# Patient Record
Sex: Female | Born: 1975 | Race: White | Hispanic: No | Marital: Single | Smoking: Never smoker
Health system: Southern US, Community
[De-identification: ages and names within clinical notes are randomized; demographics above are authoritative.]

## PROBLEM LIST (undated history)

## (undated) DIAGNOSIS — D649 Anemia, unspecified: Secondary | ICD-10-CM

## (undated) HISTORY — PX: CHOLECYSTECTOMY: SHX55

## (undated) HISTORY — PX: TUBAL LIGATION: SHX77

---

## 2012-09-17 ENCOUNTER — Emergency Department (HOSPITAL_BASED_OUTPATIENT_CLINIC_OR_DEPARTMENT_OTHER)
Admission: EM | Admit: 2012-09-17 | Discharge: 2012-09-17 | Disposition: A | Discharge status: Home / Self Care | Payer: Self-pay | Attending: Emergency Medicine | Admitting: Emergency Medicine

## 2012-09-17 ENCOUNTER — Encounter (HOSPITAL_BASED_OUTPATIENT_CLINIC_OR_DEPARTMENT_OTHER): Payer: Self-pay | Admitting: *Deleted

## 2012-09-17 DIAGNOSIS — H5710 Ocular pain, unspecified eye: Secondary | ICD-10-CM | POA: Insufficient documentation

## 2012-09-17 DIAGNOSIS — H5711 Ocular pain, right eye: Secondary | ICD-10-CM

## 2012-09-17 MED ORDER — HYDROCODONE-ACETAMINOPHEN 5-325 MG PO TABS: 2.0000 | ORAL_TABLET | ORAL | Status: DC | PRN

## 2012-09-17 NOTE — ED Notes (Signed)
Pt c/o right eye pain and redness x 2 days 

## 2012-09-17 NOTE — ED Provider Notes (Signed)
History     CSN: [id number]  Arrival date & time 09/17/12  1610   First MD Initiated Contact with Patient 09/17/12 1712      Chief Complaint  Patient presents with  . Eye Problem    (Consider location/radiation/quality/duration/timing/severity/associated sxs/prior treatment) HPI Comments: Patient is a 36 year old female who presents with a history of right eye pain that started suddenly last night. The pain is located under her right eye "in the muscle." The pain is sharp, moderate, and made worse with EOM in all directions. She reports associated redness and swelling of the right eye. She tried tylenol for pain which did not help. She denies visual changes, eye discharge, fever, headache, chest pain, congestion, abdominal pain, NVD.   Patient is a 36 y.o. female presenting with eye problem.  Eye Problem  Associated symptoms include eye redness.    History reviewed. No pertinent past medical history.  Past Surgical History  Procedure Date  . Cholecystectomy     History reviewed. No pertinent family history.  History  Substance Use Topics  . Smoking status: Never Smoker   . Smokeless tobacco: Not on file  . Alcohol Use: No    OB History    Grav Para Term Preterm Abortions TAB SAB Ect Mult Living                  Review of Systems  Eyes: Positive for pain and redness.  All other systems reviewed and are negative.    Allergies  Review of patient's allergies indicates no known allergies.  Home Medications  No current outpatient prescriptions on file.  BP 102/72  Pulse 77  Temp 99 F (37.2 C) (Oral)  Resp 16  Ht 5\' 4"  (1.626 m)  Wt 200 lb (90.719 kg)  BMI 34.33 kg/m2  SpO2 100%  LMP 09/17/2012  Physical Exam  Nursing note and vitals reviewed. Constitutional: She is oriented to person, place, and time. She appears well-developed and well-nourished. No distress.  HENT:  Head: Normocephalic and atraumatic.  Eyes: Conjunctivae normal and EOM are normal.  Pupils are equal, round, and reactive to light. Right eye exhibits no discharge. Left eye exhibits no discharge. No scleral icterus.       Patient endorses pain during EOM in all directions and inability to keep her eyes in any other position than looking straight ahead. However, during interview patient moves her eyes in all directions during conversation without complaints. Right lower eyelid tender to palpation. No abnormal masses or signs of hordeolum/chalazion. No visual acuity discrepancies bilaterally.   Neck: Normal range of motion. Neck supple.  Cardiovascular: Normal rate and regular rhythm.  Exam reveals no gallop and no friction rub.   No murmur heard. Pulmonary/Chest: Effort normal and breath sounds normal. She has no wheezes. She has no rales. She exhibits no tenderness.  Abdominal: Soft. She exhibits no distension.  Musculoskeletal: Normal range of motion.  Neurological: She is alert and oriented to person, place, and time. Coordination normal.       Speech is goal-oriented. Moves limbs without ataxia.   Skin: Skin is warm and dry. She is not diaphoretic.  Psychiatric: She has a normal mood and affect. Her behavior is normal.    ED Course  Procedures (including critical care time)  Labs Reviewed - No data to display No results found.   1. Acute right eye pain       MDM  6:29 PM Patient complaining of eye pain with EOM. Patient  will follow up with Ophthalmology for further evaluation. I will prescribe her vicodin for pain.   6:33 PM Patient endorses extreme pain with EOM during exam but patient is able to hold eye contact with me during interview by laterally abducting her eyes. Patient will be discharged without further evaluation.      Emilia Beck, PA-C 09/17/12 1844

## 2012-09-18 NOTE — ED Provider Notes (Signed)
Medical screening examination/treatment/procedure(s) were performed by non-physician practitioner and as supervising physician I was immediately available for consultation/collaboration.  Geoffery Lyons, MD 09/18/12 1047

## 2012-11-29 ENCOUNTER — Encounter (HOSPITAL_BASED_OUTPATIENT_CLINIC_OR_DEPARTMENT_OTHER): Payer: Self-pay | Admitting: *Deleted

## 2012-11-29 ENCOUNTER — Emergency Department (HOSPITAL_BASED_OUTPATIENT_CLINIC_OR_DEPARTMENT_OTHER)
Admission: EM | Admit: 2012-11-29 | Discharge: 2012-11-29 | Disposition: A | Discharge status: Home / Self Care | Payer: Self-pay | Attending: Emergency Medicine | Admitting: Emergency Medicine

## 2012-11-29 ENCOUNTER — Emergency Department (HOSPITAL_BASED_OUTPATIENT_CLINIC_OR_DEPARTMENT_OTHER): Payer: Self-pay

## 2012-11-29 DIAGNOSIS — R109 Unspecified abdominal pain: Secondary | ICD-10-CM | POA: Insufficient documentation

## 2012-11-29 DIAGNOSIS — R350 Frequency of micturition: Secondary | ICD-10-CM | POA: Insufficient documentation

## 2012-11-29 DIAGNOSIS — M549 Dorsalgia, unspecified: Secondary | ICD-10-CM | POA: Insufficient documentation

## 2012-11-29 DIAGNOSIS — R110 Nausea: Secondary | ICD-10-CM | POA: Insufficient documentation

## 2012-11-29 DIAGNOSIS — G8911 Acute pain due to trauma: Secondary | ICD-10-CM | POA: Insufficient documentation

## 2012-11-29 DIAGNOSIS — Z3202 Encounter for pregnancy test, result negative: Secondary | ICD-10-CM | POA: Insufficient documentation

## 2012-11-29 LAB — PREGNANCY, URINE: Preg Test, Ur: NEGATIVE

## 2012-11-29 LAB — COMPREHENSIVE METABOLIC PANEL
ALT: 37 U/L — ABNORMAL HIGH (ref 0–35)
AST: 37 U/L (ref 0–37)
CO2: 26 mEq/L (ref 19–32)
Chloride: 102 mEq/L (ref 96–112)
Creatinine, Ser: 0.8 mg/dL (ref 0.50–1.10)
GFR calc Af Amer: >90 mL/min (ref >90)
GFR calc non Af Amer: >90 mL/min (ref >90)
Glucose, Bld: 94 mg/dL (ref 70–99)
Sodium: 137 mEq/L (ref 135–145)
Total Bilirubin: 0.2 mg/dL — ABNORMAL LOW (ref 0.3–1.2)

## 2012-11-29 LAB — CBC
Hemoglobin: 12.3 g/dL (ref 12.0–15.0)
MCH: 28.2 pg (ref 26.0–34.0)
MCV: 85.3 fL (ref 78.0–100.0)
Platelets: 260 10*3/uL (ref 150–400)
RBC: 4.36 MIL/uL (ref 3.87–5.11)
WBC: 7.1 10*3/uL (ref 4.0–10.5)

## 2012-11-29 LAB — URINALYSIS, ROUTINE W REFLEX MICROSCOPIC
Bilirubin Urine: NEGATIVE
Glucose, UA: NEGATIVE mg/dL
Specific Gravity, Urine: 1.015 (ref 1.005–1.030)
Urobilinogen, UA: 1 mg/dL (ref 0.0–1.0)
pH: 6.5 (ref 5.0–8.0)

## 2012-11-29 LAB — URINE MICROSCOPIC-ADD ON

## 2012-11-29 MED ORDER — MORPHINE SULFATE 4 MG/ML IJ SOLN
4.0000 mg | Freq: Once | INTRAMUSCULAR | Status: AC
  Administered 2012-11-29: 4 mg via INTRAVENOUS
  Filled 2012-11-29: qty 1

## 2012-11-29 MED ORDER — OXYCODONE-ACETAMINOPHEN 5-325 MG PO TABS: 1.0000 | ORAL_TABLET | Freq: Four times a day (QID) | ORAL | Status: DC | PRN

## 2012-11-29 MED ORDER — IBUPROFEN 600 MG PO TABS: 600.0000 mg | ORAL_TABLET | Freq: Four times a day (QID) | ORAL | Status: DC | PRN

## 2012-11-29 MED ORDER — MORPHINE SULFATE 4 MG/ML IJ SOLN
4.0000 mg | Freq: Once | INTRAMUSCULAR | Status: DC
  Filled 2012-11-29: qty 1

## 2012-11-29 MED ORDER — IOHEXOL 300 MG/ML  SOLN
100.0000 mL | Freq: Once | INTRAMUSCULAR | Status: AC | PRN
  Administered 2012-11-29: 100 mL via INTRAVENOUS

## 2012-11-29 MED ORDER — ONDANSETRON HCL 4 MG/2ML IJ SOLN
4.0000 mg | Freq: Once | INTRAMUSCULAR | Status: AC
  Administered 2012-11-29: 4 mg via INTRAVENOUS
  Filled 2012-11-29: qty 2
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
## 2012-11-30 LAB — URINE CULTURE: Colony Count: NO GROWTH

## 2013-03-20 ENCOUNTER — Emergency Department (HOSPITAL_BASED_OUTPATIENT_CLINIC_OR_DEPARTMENT_OTHER)
Admission: EM | Admit: 2013-03-20 | Discharge: 2013-03-20 | Disposition: A | Discharge status: Home / Self Care | Payer: Self-pay | Attending: Emergency Medicine | Admitting: Emergency Medicine

## 2013-03-20 ENCOUNTER — Encounter (HOSPITAL_BASED_OUTPATIENT_CLINIC_OR_DEPARTMENT_OTHER): Payer: Self-pay

## 2013-03-20 DIAGNOSIS — J069 Acute upper respiratory infection, unspecified: Secondary | ICD-10-CM | POA: Insufficient documentation

## 2013-03-20 DIAGNOSIS — R05 Cough: Secondary | ICD-10-CM

## 2013-03-20 DIAGNOSIS — R1110 Vomiting, unspecified: Secondary | ICD-10-CM | POA: Insufficient documentation

## 2013-03-20 DIAGNOSIS — R059 Cough, unspecified: Secondary | ICD-10-CM | POA: Insufficient documentation

## 2013-03-20 DIAGNOSIS — R079 Chest pain, unspecified: Secondary | ICD-10-CM | POA: Insufficient documentation

## 2013-03-20 MED ORDER — HYDROCODONE-ACETAMINOPHEN 5-325 MG PO TABS: 2.0000 | ORAL_TABLET | Freq: Four times a day (QID) | ORAL | Status: DC | PRN

## 2013-03-20 MED ORDER — AEROCHAMBER PLUS W/MASK MISC: Status: DC

## 2013-03-20 MED ORDER — ALBUTEROL SULFATE HFA 108 (90 BASE) MCG/ACT IN AERS: 2.0000 | INHALATION_SPRAY | RESPIRATORY_TRACT | Status: DC | PRN

## 2013-03-20 NOTE — ED Notes (Signed)
Pt states that she has had a cough x 3 days, c/o severe rib pain, back pain, congestion, and vomiting (due to cough).

## 2013-03-20 NOTE — ED Notes (Signed)
MD at bedside. 

## 2013-03-20 NOTE — ED Provider Notes (Signed)
History     CSN: [id number]  Arrival date & time 03/20/13  0703   First MD Initiated Contact with Patient 03/20/13 0732      Chief Complaint  Patient presents with  . URI    (Consider location/radiation/quality/duration/timing/severity/associated sxs/prior treatment) HPI 37 year old female is 3 days of a nonproductive cough no fever chest pain only with cough occasional posttussive emesis but no abdominal pain no diarrhea no rash no myalgias or arthralgias Pains no edema no trauma no recent immobilization and she is PERC negative. She is no night sweats or weight loss. Her chest wall is tender from all the coughing. Her chest pain is constant 24 hours a day for the last few days from her coughing is worse with cough and position changes and palpation but nonexertional. She is not short of breath. There is no treatment prior to arrival. History reviewed. No pertinent past medical history.  Past Surgical History  Procedure Laterality Date  . Cholecystectomy      History reviewed. No pertinent family history.  History  Substance Use Topics  . Smoking status: Never Smoker   . Smokeless tobacco: Never Used  . Alcohol Use: No    OB History   Grav Para Term Preterm Abortions TAB SAB Ect Mult Living                  Review of Systems 10 Systems reviewed and are negative for acute change except as noted in the HPI. Allergies  Review of patient's allergies indicates no known allergies.  Home Medications   Current Outpatient Rx  Name  Route  Sig  Dispense  Refill  . albuterol (PROVENTIL HFA;VENTOLIN HFA) 108 (90 BASE) MCG/ACT inhaler   Inhalation   Inhale 2 puffs into the lungs every 2 (two) hours as needed for wheezing or shortness of breath (cough).   1 Inhaler   0   . HYDROcodone-acetaminophen (NORCO) 5-325 MG per tablet   Oral   Take 2 tablets by mouth every 6 (six) hours as needed for pain.   15 tablet   0   . HYDROcodone-acetaminophen (NORCO/VICODIN) 5-325 MG  per tablet   Oral   Take 2 tablets by mouth every 4 (four) hours as needed for pain.   6 tablet   0   . ibuprofen (ADVIL,MOTRIN) 600 MG tablet   Oral   Take 1 tablet (600 mg total) by mouth every 6 (six) hours as needed for pain.   30 tablet   0   . oxyCODONE-acetaminophen (PERCOCET/ROXICET) 5-325 MG per tablet   Oral   Take 1-2 tablets by mouth every 6 (six) hours as needed for pain.   15 tablet   0   . Spacer/Aero-Holding Chambers (AEROCHAMBER PLUS WITH MASK) inhaler      Use as instructed   1 each   2     BP 113/72  Pulse 94  Temp(Src) 97.6 F (36.4 C) (Oral)  Resp 22  Ht 5\' 5"  (1.651 m)  Wt 209 lb (94.802 kg)  BMI 34.78 kg/m2  SpO2 100%  Physical Exam  Nursing note and vitals reviewed. Constitutional:  Awake, alert, nontoxic appearance.  HENT:  Head: Atraumatic.  Eyes: Right eye exhibits no discharge. Left eye exhibits no discharge.  Neck: Neck supple.  Cardiovascular: Normal rate and regular rhythm.   No murmur heard. Pulmonary/Chest: Effort normal and breath sounds normal. No respiratory distress. She has no wheezes. She has no rales. She exhibits tenderness.  Exactly producible chest wall  tenderness  Abdominal: Soft. There is no tenderness. There is no rebound.  Musculoskeletal: She exhibits no edema and no tenderness.  Baseline ROM, no obvious new focal weakness.  Neurological: She is alert.  Mental status and motor strength appears baseline for patient and situation.  Skin: No rash noted.  Psychiatric: She has a normal mood and affect.    ED Course  Procedures (including critical care time)  Labs Reviewed - No data to display No results found.   1. Cough       MDM  Patient / Family / Caregiver informed of clinical course, understand medical decision-making process, and agree with plan.  I doubt any other EMC precluding discharge at this time including, but not necessarily limited to the following:SBI.        Hurman Horn,  MD 03/20/13 2130

## 2013-03-24 ENCOUNTER — Emergency Department (HOSPITAL_BASED_OUTPATIENT_CLINIC_OR_DEPARTMENT_OTHER): Payer: Self-pay

## 2013-03-24 ENCOUNTER — Emergency Department (HOSPITAL_BASED_OUTPATIENT_CLINIC_OR_DEPARTMENT_OTHER)
Admission: EM | Admit: 2013-03-24 | Discharge: 2013-03-24 | Disposition: A | Discharge status: Home / Self Care | Payer: Self-pay | Attending: Emergency Medicine | Admitting: Emergency Medicine

## 2013-03-24 ENCOUNTER — Encounter (HOSPITAL_BASED_OUTPATIENT_CLINIC_OR_DEPARTMENT_OTHER): Payer: Self-pay | Admitting: Emergency Medicine

## 2013-03-24 DIAGNOSIS — R05 Cough: Secondary | ICD-10-CM | POA: Insufficient documentation

## 2013-03-24 DIAGNOSIS — R0982 Postnasal drip: Secondary | ICD-10-CM | POA: Insufficient documentation

## 2013-03-24 DIAGNOSIS — R1110 Vomiting, unspecified: Secondary | ICD-10-CM | POA: Insufficient documentation

## 2013-03-24 DIAGNOSIS — Z79899 Other long term (current) drug therapy: Secondary | ICD-10-CM | POA: Insufficient documentation

## 2013-03-24 DIAGNOSIS — R059 Cough, unspecified: Secondary | ICD-10-CM | POA: Insufficient documentation

## 2013-03-24 MED ORDER — BENZONATATE 100 MG PO CAPS
200.0000 mg | ORAL_CAPSULE | Freq: Once | ORAL | Status: AC
  Administered 2013-03-24: 200 mg via ORAL
  Filled 2013-03-24: qty 2

## 2013-03-24 MED ORDER — ONDANSETRON 8 MG PO TBDP
8.0000 mg | ORAL_TABLET | Freq: Once | ORAL | Status: AC
  Administered 2013-03-24: 8 mg via ORAL
  Filled 2013-03-24: qty 1

## 2013-03-24 MED ORDER — LORATADINE 10 MG PO TABS: 10.0000 mg | ORAL_TABLET | Freq: Every day | ORAL | Status: DC

## 2013-03-24 MED ORDER — FLUTICASONE PROPIONATE 50 MCG/ACT NA SUSP: 2.0000 | Freq: Every day | NASAL | Status: DC

## 2013-03-24 MED ORDER — BENZONATATE 200 MG PO CAPS: 200.0000 mg | ORAL_CAPSULE | Freq: Three times a day (TID) | ORAL | Status: DC | PRN

## 2013-03-24 NOTE — ED Provider Notes (Signed)
History     CSN: [id number]  Arrival date & time 03/24/13  2123   First MD Initiated Contact with Patient 03/24/13 2316      Chief Complaint  Patient presents with  . Cough  . Emesis  . Sore Throat    (Consider location/radiation/quality/duration/timing/severity/associated sxs/prior treatment) Patient is a 37 y.o. female presenting with cough, vomiting, and pharyngitis. The history is provided by the patient. No language interpreter was used.  Cough Cough characteristics:  Non-productive Severity:  Moderate Onset quality:  Gradual Duration:  1 week Timing:  Intermittent Progression:  Unchanged Chronicity:  New Smoker: no   Context: upper respiratory infection   Relieved by:  Nothing Worsened by:  Nothing tried Ineffective treatments:  None tried Associated symptoms: no chest pain and no fever   Emesis Severity:  Moderate Timing:  Intermittent Quality:  Stomach contents Progression:  Unchanged Chronicity:  New Context: post-tussive   Associated symptoms: URI   Associated symptoms: no abdominal pain and no diarrhea   Sore Throat Pertinent negatives include no chest pain and no abdominal pain.    History reviewed. No pertinent past medical history.  Past Surgical History  Procedure Laterality Date  . Cholecystectomy      No family history on file.  History  Substance Use Topics  . Smoking status: Never Smoker   . Smokeless tobacco: Never Used  . Alcohol Use: No    OB History   Grav Para Term Preterm Abortions TAB SAB Ect Mult Living                  Review of Systems  Constitutional: Negative for fever.  Respiratory: Positive for cough.   Cardiovascular: Negative for chest pain.  Gastrointestinal: Positive for vomiting. Negative for abdominal pain and diarrhea.  All other systems reviewed and are negative.    Allergies  Review of patient's allergies indicates no known allergies.  Home Medications   Current Outpatient Rx  Name  Route  Sig   Dispense  Refill  . albuterol (PROVENTIL HFA;VENTOLIN HFA) 108 (90 BASE) MCG/ACT inhaler   Inhalation   Inhale 2 puffs into the lungs every 2 (two) hours as needed for wheezing or shortness of breath (cough).   1 Inhaler   0   . ibuprofen (ADVIL,MOTRIN) 600 MG tablet   Oral   Take 1 tablet (600 mg total) by mouth every 6 (six) hours as needed for pain.   30 tablet   0   . Spacer/Aero-Holding Chambers (AEROCHAMBER PLUS WITH MASK) inhaler      Use as instructed   1 each   2   . HYDROcodone-acetaminophen (NORCO) 5-325 MG per tablet   Oral   Take 2 tablets by mouth every 6 (six) hours as needed for pain.   15 tablet   0   . HYDROcodone-acetaminophen (NORCO/VICODIN) 5-325 MG per tablet   Oral   Take 2 tablets by mouth every 4 (four) hours as needed for pain.   6 tablet   0   . oxyCODONE-acetaminophen (PERCOCET/ROXICET) 5-325 MG per tablet   Oral   Take 1-2 tablets by mouth every 6 (six) hours as needed for pain.   15 tablet   0     BP 102/70  Pulse 74  Temp(Src) 98.7 F (37.1 C) (Oral)  Resp 20  Ht 5\' 5"  (1.651 m)  Wt 200 lb (90.719 kg)  BMI 33.28 kg/m2  SpO2 98%  Physical Exam  Constitutional: She is oriented to person, place, and  time. She appears well-developed and well-nourished. No distress.  HENT:  Head: Normocephalic and atraumatic.  Mouth/Throat: Oropharynx is clear and moist.  Clear colorless drainage and cobblestoning at the back of the throat  Eyes: Conjunctivae are normal. Pupils are equal, round, and reactive to light.  Neck: Normal range of motion. Neck supple.  Cardiovascular: Normal rate and regular rhythm.   Pulmonary/Chest: Effort normal and breath sounds normal. No stridor. She has no wheezes. She has no rales.  Abdominal: Soft. Bowel sounds are normal. There is no tenderness. There is no rebound and no guarding.  Musculoskeletal: Normal range of motion.  Lymphadenopathy:    She has no cervical adenopathy.  Neurological: She is alert and  oriented to person, place, and time.  Skin: Skin is warm and dry.  Psychiatric: She has a normal mood and affect.    ED Course  Procedures (including critical care time)  Labs Reviewed - No data to display Dg Chest 2 View  03/24/2013  *RADIOLOGY REPORT*  Clinical Data: Cough, emesis  CHEST - 2 VIEW  Comparison: 02/07/2011  Findings: Upper normal cardiomediastinal contours, similar to prior.  Bibasilar opacities are similar to prior, favor atelectasis.  No pleural effusion or pneumothorax.  No overt edema. No interval osseous change.  IMPRESSION: Mild bibasilar atelectasis.  Otherwise, no acute process identified.   Original Report Authenticated By: Jearld Lesch, M.D.      1. Post-nasal drip       MDM  Suspect pollen induced post nasal drip with cough and post tussive emesis.  Will treat         Skyelar Swigart K Tanveer Brammer-Rasch, MD 03/24/13 2336

## 2013-03-24 NOTE — ED Notes (Signed)
Pt c/o cough, vomiting and sore throat. Pt was seen here 4/18 for same; states no improvement in symptoms.

## 2013-05-17 ENCOUNTER — Encounter (HOSPITAL_BASED_OUTPATIENT_CLINIC_OR_DEPARTMENT_OTHER): Payer: Self-pay | Admitting: *Deleted

## 2013-05-17 ENCOUNTER — Emergency Department (HOSPITAL_BASED_OUTPATIENT_CLINIC_OR_DEPARTMENT_OTHER)
Admission: EM | Admit: 2013-05-17 | Discharge: 2013-05-18 | Disposition: A | Discharge status: Home / Self Care | Payer: Self-pay | Attending: Emergency Medicine | Admitting: Emergency Medicine

## 2013-05-17 ENCOUNTER — Emergency Department (HOSPITAL_BASED_OUTPATIENT_CLINIC_OR_DEPARTMENT_OTHER): Payer: Self-pay

## 2013-05-17 DIAGNOSIS — X500XXA Overexertion from strenuous movement or load, initial encounter: Secondary | ICD-10-CM | POA: Insufficient documentation

## 2013-05-17 DIAGNOSIS — S46002A Unspecified injury of muscle(s) and tendon(s) of the rotator cuff of left shoulder, initial encounter: Secondary | ICD-10-CM

## 2013-05-17 DIAGNOSIS — Y9389 Activity, other specified: Secondary | ICD-10-CM | POA: Insufficient documentation

## 2013-05-17 DIAGNOSIS — S43429A Sprain of unspecified rotator cuff capsule, initial encounter: Secondary | ICD-10-CM | POA: Insufficient documentation

## 2013-05-17 DIAGNOSIS — Z79899 Other long term (current) drug therapy: Secondary | ICD-10-CM | POA: Insufficient documentation

## 2013-05-17 DIAGNOSIS — Z862 Personal history of diseases of the blood and blood-forming organs and certain disorders involving the immune mechanism: Secondary | ICD-10-CM | POA: Insufficient documentation

## 2013-05-17 DIAGNOSIS — Y929 Unspecified place or not applicable: Secondary | ICD-10-CM | POA: Insufficient documentation

## 2013-05-17 HISTORY — DX: Anemia, unspecified: D64.9

## 2013-05-17 NOTE — ED Provider Notes (Signed)
History  This chart was scribed for Hanley Seamen, MD by Manuela Schwartz, ED scribe. This patient was seen in room MH07/MH07 and the patient's care was started at 2151.   CSN: [id number]  Arrival date & time 05/17/13  2151   First MD Initiated Contact with Patient 05/17/13 2359      Chief Complaint  Patient presents with  . Shoulder Pain   Patient is a 37 y.o. female presenting with shoulder pain. The history is provided by the patient. No language interpreter was used.  Shoulder Pain This is a new problem. The current episode started more than 2 days ago. The problem occurs constantly. The problem has not changed since onset.Pertinent negatives include no chest pain and no shortness of breath. The symptoms are aggravated by bending. Nothing relieves the symptoms. She has tried nothing for the symptoms.   HPI Comments: Abigail Elliott is a 37 y.o. female who presents to the Emergency Department complaining of constant throbbing/burning left shoulder pain after heavy lifting x3 days ago. She states no previous injury to her shoulder and having pain w/ROM. She localizes pain to left region of humeral head. She has not tried taking any medicines for this problem at home and has not yet seen anyone for this problem.     Past Medical History  Diagnosis Date  . Anemia     Past Surgical History  Procedure Laterality Date  . Cholecystectomy      No family history on file.  History  Substance Use Topics  . Smoking status: Never Smoker   . Smokeless tobacco: Never Used  . Alcohol Use: No    OB History   Grav Para Term Preterm Abortions TAB SAB Ect Mult Living                  Review of Systems  Constitutional: Negative for fever and chills.  Respiratory: Negative for shortness of breath.   Cardiovascular: Negative for chest pain.  Gastrointestinal: Negative for nausea and vomiting.  Musculoskeletal:       Left shoulder pain, pain w/ROM  Neurological: Negative for weakness.  All  other systems reviewed and are negative.  A complete 10 system review of systems was obtained and all systems are negative except as noted in the HPI and PMH.    Allergies  Review of patient's allergies indicates no known allergies.  Home Medications   Current Outpatient Rx  Name  Route  Sig  Dispense  Refill  . albuterol (PROVENTIL HFA;VENTOLIN HFA) 108 (90 BASE) MCG/ACT inhaler   Inhalation   Inhale 2 puffs into the lungs every 2 (two) hours as needed for wheezing or shortness of breath (cough).   1 Inhaler   0   . benzonatate (TESSALON) 200 MG capsule   Oral   Take 1 capsule (200 mg total) by mouth 3 (three) times daily as needed for cough.   20 capsule   0   . fluticasone (FLONASE) 50 MCG/ACT nasal spray   Nasal   Place 2 sprays into the nose daily.   16 g   0   . HYDROcodone-acetaminophen (NORCO) 5-325 MG per tablet   Oral   Take 1-2 tablets by mouth every 6 (six) hours as needed for pain.   20 tablet   0   . HYDROcodone-acetaminophen (NORCO/VICODIN) 5-325 MG per tablet   Oral   Take 2 tablets by mouth every 4 (four) hours as needed for pain.   6 tablet   0   .  ibuprofen (ADVIL,MOTRIN) 600 MG tablet   Oral   Take 1 tablet (600 mg total) by mouth every 6 (six) hours as needed for pain.   30 tablet   0   . loratadine (CLARITIN) 10 MG tablet   Oral   Take 1 tablet (10 mg total) by mouth daily.   7 tablet   0   . naproxen sodium (ANAPROX DS) 550 MG tablet   Oral   Take 1 tablet (550 mg total) by mouth 2 (two) times daily with a meal.   30 tablet   0   . oxyCODONE-acetaminophen (PERCOCET/ROXICET) 5-325 MG per tablet   Oral   Take 1-2 tablets by mouth every 6 (six) hours as needed for pain.   15 tablet   0   . Spacer/Aero-Holding Chambers (AEROCHAMBER PLUS WITH MASK) inhaler      Use as instructed   1 each   2     Triage Vitals; BP 98/62  Pulse 77  Temp(Src) 98.3 F (36.8 C) (Oral)  Resp 18  Ht 5\' 5"  (1.651 m)  Wt 236 lb (107.049 kg)   BMI 39.27 kg/m2  SpO2 98%  LMP 05/17/2013  Physical Exam  Nursing note and vitals reviewed. Constitutional: She is oriented to person, place, and time. She appears well-developed and well-nourished. No distress.  HENT:  Head: Normocephalic and atraumatic.  Eyes: EOM are normal.  Pt incapable of cooperating w/EOM exam  Neck: Neck supple. No tracheal deviation present.  Cardiovascular: Normal rate, regular rhythm and normal heart sounds.   No murmur heard. Pulmonary/Chest: Effort normal and breath sounds normal. No respiratory distress. She has no wheezes.  Abdominal: Soft. Bowel sounds are normal. She exhibits no distension. There is no tenderness.  Musculoskeletal: Normal range of motion. She exhibits tenderness.  Tender of rotator cuff Limited ROB abduction  LUE neurovascularly intanct  Neurological: She is alert and oriented to person, place, and time.  Skin: Skin is warm and dry.  Psychiatric: She has a normal mood and affect. Her behavior is normal.    ED Course  Procedures (including critical care time) DIAGNOSTIC STUDIES: Oxygen Saturation is 98% on room air, normal by my interpretation.    COORDINATION OF CARE: At 1205 AM Discussed treatment plan with patient which includes left shoulder X-ray. Patient agrees.      Labs Reviewed - No data to display Dg Shoulder Left  05/17/2013   *RADIOLOGY REPORT*  Clinical Data: Left shoulder pain began 3 days ago.  Recent heavy lifting  LEFT SHOULDER - 2+ VIEW  Comparison: Read  Findings: Normal bony mineralization.  The humeral head is located within the glenoid fossa.  No evidence of subluxation or dislocation.  Negative for acute or healing fracture. Acromioclavicular joint is aligned.  No focal osseous lesion identified.  IMPRESSION: No acute bony abnormality or significant degenerative change.   Original Report Authenticated By: Britta Mccreedy, M.D.     1. Rotator cuff injury, left, initial encounter       MDM  I  personally performed the services described in this documentation, which was scribed in my presence.  The recorded information has been reviewed and is accurate.         Hanley Seamen, MD 05/18/13 [phone]

## 2013-05-17 NOTE — ED Notes (Signed)
C/o left shoulder pain that started 3 days ago. States she has lifted some ice buckets this week and helped lift up her mother who is sick.  Describes pain as burning and hurts with movement. No deformity noted.

## 2013-05-18 MED ORDER — HYDROCODONE-ACETAMINOPHEN 5-325 MG PO TABS: 1.0000 | ORAL_TABLET | Freq: Four times a day (QID) | ORAL | Status: DC | PRN

## 2013-05-18 MED ORDER — NAPROXEN SODIUM 550 MG PO TABS: 550.0000 mg | ORAL_TABLET | Freq: Two times a day (BID) | ORAL | Status: DC

## 2013-05-18 MED ORDER — NAPROXEN 250 MG PO TABS
500.0000 mg | ORAL_TABLET | Freq: Once | ORAL | Status: AC
  Administered 2013-05-18: 500 mg via ORAL
  Filled 2013-05-18: qty 2

## 2013-10-04 ENCOUNTER — Emergency Department (HOSPITAL_BASED_OUTPATIENT_CLINIC_OR_DEPARTMENT_OTHER): Payer: Medicaid Other

## 2013-10-04 ENCOUNTER — Emergency Department (HOSPITAL_BASED_OUTPATIENT_CLINIC_OR_DEPARTMENT_OTHER)
Admission: EM | Admit: 2013-10-04 | Discharge: 2013-10-04 | Disposition: A | Discharge status: Home / Self Care | Payer: Medicaid Other | Attending: Emergency Medicine | Admitting: Emergency Medicine

## 2013-10-04 ENCOUNTER — Encounter (HOSPITAL_BASED_OUTPATIENT_CLINIC_OR_DEPARTMENT_OTHER): Payer: Self-pay | Admitting: Emergency Medicine

## 2013-10-04 DIAGNOSIS — M25569 Pain in unspecified knee: Secondary | ICD-10-CM | POA: Insufficient documentation

## 2013-10-04 DIAGNOSIS — Z8781 Personal history of (healed) traumatic fracture: Secondary | ICD-10-CM | POA: Insufficient documentation

## 2013-10-04 DIAGNOSIS — M25561 Pain in right knee: Secondary | ICD-10-CM

## 2013-10-04 DIAGNOSIS — Z862 Personal history of diseases of the blood and blood-forming organs and certain disorders involving the immune mechanism: Secondary | ICD-10-CM | POA: Insufficient documentation

## 2013-10-04 MED ORDER — HYDROCODONE-ACETAMINOPHEN 5-325 MG PO TABS: 2.0000 | ORAL_TABLET | ORAL | Status: DC | PRN

## 2013-10-04 MED ORDER — MELOXICAM 7.5 MG PO TABS: 7.5000 mg | ORAL_TABLET | Freq: Every day | ORAL | Status: DC

## 2013-10-04 NOTE — ED Notes (Addendum)
Pt in xray

## 2013-10-04 NOTE — ED Provider Notes (Signed)
CSN: [id number]     Arrival date & time 10/04/13  1735 History   First MD Initiated Contact with Patient 10/04/13 1826     Chief Complaint  Patient presents with  . Knee Injury   (Consider location/radiation/quality/duration/timing/severity/associated sxs/prior Treatment) Patient is a 37 y.o. female presenting with knee pain. The history is provided by the patient. No language interpreter was used.  Knee Pain Location:  Knee Injury: yes   Knee location:  R knee Pain details:    Quality:  Aching   Radiates to:  Does not radiate   Severity:  Moderate   Onset quality:  Sudden   Timing:  Constant   Progression:  Worsening Chronicity:  New Dislocation: no   Foreign body present:  No foreign bodies Worsened by:  Nothing tried Ineffective treatments:  None tried Pt reports she fractured her knee 1 year ago.  Pt reports she began having pain and swelling a week ago.  No known injury  Past Medical History  Diagnosis Date  . Anemia    Past Surgical History  Procedure Laterality Date  . Cholecystectomy     No family history on file. History  Substance Use Topics  . Smoking status: Never Smoker   . Smokeless tobacco: Never Used  . Alcohol Use: No   OB History   Grav Para Term Preterm Abortions TAB SAB Ect Mult Living                 Review of Systems  Musculoskeletal: Positive for joint swelling.  All other systems reviewed and are negative.    Allergies  Review of patient's allergies indicates no known allergies.  Home Medications   Current Outpatient Rx  Name  Route  Sig  Dispense  Refill  . HYDROcodone-acetaminophen (NORCO/VICODIN) 5-325 MG per tablet   Oral   Take 2 tablets by mouth every 4 (four) hours as needed for pain.   6 tablet   0    BP 117/72  Pulse 70  Temp(Src) 98.7 F (37.1 C) (Oral)  Resp 16  Ht 5\' 6"  (1.676 m)  Wt 240 lb (108.863 kg)  BMI 38.76 kg/m2  SpO2 100%  LMP 09/27/2013 Physical Exam  Nursing note and vitals  reviewed. Constitutional: She is oriented to person, place, and time. She appears well-developed and well-nourished.  Musculoskeletal: She exhibits tenderness.  Tender swollen right knee ns and nv intact  Neurological: She is alert and oriented to person, place, and time. She has normal reflexes.  Skin: Skin is warm.  Psychiatric: She has a normal mood and affect.    ED Course  Procedures (including critical care time) Labs Review Labs Reviewed - No data to display Imaging Review No results found.  EKG Interpretation   None       MDM   1. Knee pain, right    Knee sleeve,  hydrocodone    Elson Areas, PA-C 10/04/13 2203

## 2013-10-04 NOTE — ED Provider Notes (Signed)
Medical screening examination/treatment/procedure(s) were performed by non-physician practitioner and as supervising physician I was immediately available for consultation/collaboration.  EKG Interpretation   None        [name], MD 10/04/13 2204

## 2013-10-04 NOTE — ED Notes (Signed)
Pt having right knee injury.  No known injury.  Pt states she is having pain and swelling.

## 2013-11-07 ENCOUNTER — Encounter (HOSPITAL_BASED_OUTPATIENT_CLINIC_OR_DEPARTMENT_OTHER): Payer: Self-pay | Admitting: Emergency Medicine

## 2013-11-07 ENCOUNTER — Emergency Department (HOSPITAL_BASED_OUTPATIENT_CLINIC_OR_DEPARTMENT_OTHER)
Admission: EM | Admit: 2013-11-07 | Discharge: 2013-11-07 | Disposition: A | Discharge status: Home / Self Care | Payer: Medicaid Other | Attending: Emergency Medicine | Admitting: Emergency Medicine

## 2013-11-07 DIAGNOSIS — R600 Localized edema: Secondary | ICD-10-CM

## 2013-11-07 DIAGNOSIS — J029 Acute pharyngitis, unspecified: Secondary | ICD-10-CM | POA: Insufficient documentation

## 2013-11-07 DIAGNOSIS — R609 Edema, unspecified: Secondary | ICD-10-CM | POA: Insufficient documentation

## 2013-11-07 DIAGNOSIS — Z862 Personal history of diseases of the blood and blood-forming organs and certain disorders involving the immune mechanism: Secondary | ICD-10-CM | POA: Insufficient documentation

## 2013-11-07 DIAGNOSIS — Z3202 Encounter for pregnancy test, result negative: Secondary | ICD-10-CM | POA: Insufficient documentation

## 2013-11-07 DIAGNOSIS — Z792 Long term (current) use of antibiotics: Secondary | ICD-10-CM | POA: Insufficient documentation

## 2013-11-07 DIAGNOSIS — N390 Urinary tract infection, site not specified: Secondary | ICD-10-CM | POA: Insufficient documentation

## 2013-11-07 LAB — URINALYSIS, ROUTINE W REFLEX MICROSCOPIC
Glucose, UA: NEGATIVE mg/dL
Nitrite: NEGATIVE
Protein, ur: NEGATIVE mg/dL
Specific Gravity, Urine: 1.02 (ref 1.005–1.030)
Urobilinogen, UA: 0.2 mg/dL (ref 0.0–1.0)

## 2013-11-07 LAB — BASIC METABOLIC PANEL
CO2: 24 mEq/L (ref 19–32)
Calcium: 9.2 mg/dL (ref 8.4–10.5)
Chloride: 103 mEq/L (ref 96–112)
GFR calc Af Amer: >90 mL/min (ref >90)
GFR calc non Af Amer: >90 mL/min (ref >90)
Potassium: 3.7 mEq/L (ref 3.5–5.1)
Sodium: 138 mEq/L (ref 135–145)

## 2013-11-07 LAB — CBC WITH DIFFERENTIAL/PLATELET
Basophils Absolute: 0 10*3/uL (ref 0.0–0.1)
Basophils Relative: 0 % (ref 0–1)
Eosinophils Relative: 1 % (ref 0–5)
Lymphocytes Relative: 33 % (ref 12–46)
MCV: 84.4 fL (ref 78.0–100.0)
Neutro Abs: 3.7 10*3/uL (ref 1.7–7.7)
Platelets: 249 10*3/uL (ref 150–400)
RDW: 14.5 % (ref 11.5–15.5)
WBC: 6.3 10*3/uL (ref 4.0–10.5)

## 2013-11-07 LAB — PREGNANCY, URINE: Preg Test, Ur: NEGATIVE

## 2013-11-07 MED ORDER — CIPROFLOXACIN HCL 500 MG PO TABS: 500.0000 mg | ORAL_TABLET | Freq: Two times a day (BID) | ORAL | Status: DC

## 2013-11-07 NOTE — ED Provider Notes (Signed)
CSN: [id number]     Arrival date & time 11/07/13  1044 History   First MD Initiated Contact with Patient 11/07/13 1123     Chief Complaint  Patient presents with  . Dizziness   (Consider location/radiation/quality/duration/timing/severity/associated sxs/prior Treatment) HPI Comments: Patient presents with a one-day history of feeling bad. She describes some lightheadedness that started this morning. The dizziness is worse when she stands up. She denies any spinning sensation. She denies any headache. She complains of a little bit of a sore throat. She feels like her face is flushed. She denies any cough or chest congestion. She denies any chest pain or shortness of breath. She denies he palpitations. She denies he fevers or chills. She denies he nausea vomiting or diarrhea. She denies abdominal pain. She has had some swelling of her lower chin region the last couple weeks. She's had a history of this in the past usually in the summer. She denies any calf tenderness.   Past Medical History  Diagnosis Date  . Anemia    Past Surgical History  Procedure Laterality Date  . Cholecystectomy     No family history on file. History  Substance Use Topics  . Smoking status: Never Smoker   . Smokeless tobacco: Never Used  . Alcohol Use: No   OB History   Grav Para Term Preterm Abortions TAB SAB Ect Mult Living                 Review of Systems  Constitutional: Negative for fever, chills, diaphoresis and fatigue.  HENT: Positive for sore throat. Negative for congestion, rhinorrhea and sneezing.   Eyes: Negative.   Respiratory: Negative for cough, chest tightness and shortness of breath.   Cardiovascular: Positive for leg swelling. Negative for chest pain.  Gastrointestinal: Negative for nausea, vomiting, abdominal pain, diarrhea and blood in stool.  Genitourinary: Negative for frequency, hematuria, flank pain and difficulty urinating.  Musculoskeletal: Negative for arthralgias and back pain.   Skin: Negative for rash.  Neurological: Positive for dizziness. Negative for speech difficulty, weakness, numbness and headaches.    Allergies  Review of patient's allergies indicates no known allergies.  Home Medications   Current Outpatient Rx  Name  Route  Sig  Dispense  Refill  . ciprofloxacin (CIPRO) 500 MG tablet   Oral   Take 1 tablet (500 mg total) by mouth 2 (two) times daily. One po bid x 7 days   14 tablet   0    BP 121/91  Pulse 90  Temp(Src) 98.2 F (36.8 C) (Oral)  Resp 18  Ht 5\' 3"  (1.6 m)  Wt 205 lb (92.987 kg)  BMI 36.32 kg/m2  SpO2 100% Physical Exam  Constitutional: She is oriented to person, place, and time. She appears well-developed and well-nourished.  HENT:  Head: Normocephalic and atraumatic.  Right Ear: External ear normal.  Left Ear: External ear normal.  Mouth/Throat: Oropharynx is clear and moist.  Eyes: Pupils are equal, round, and reactive to light.  No nystagmus  Neck: Normal range of motion. Neck supple.  Cardiovascular: Normal rate, regular rhythm and normal heart sounds.   Pulmonary/Chest: Effort normal and breath sounds normal. No respiratory distress. She has no wheezes. She has no rales. She exhibits no tenderness.  Abdominal: Soft. Bowel sounds are normal. There is no tenderness. There is no rebound and no guarding.  Musculoskeletal: Normal range of motion. She exhibits edema (Mild bilateral pitting edema).  Lymphadenopathy:    She has no cervical adenopathy.  Neurological: She is alert and oriented to person, place, and time.  Skin: Skin is warm and dry. No rash noted.  Psychiatric: She has a normal mood and affect.    ED Course  Procedures (including critical care time) Labs Review Results for orders placed during the hospital encounter of 11/07/13  CBC WITH DIFFERENTIAL      Result Value Range   WBC 6.3  4.0 - 10.5 K/uL   RBC 4.04  3.87 - 5.11 MIL/uL   Hemoglobin 11.2 (*) 12.0 - 15.0 g/dL   HCT 16.1 (*) 09.6 - 04.5 %    MCV 84.4  78.0 - 100.0 fL   MCH 27.7  26.0 - 34.0 pg   MCHC 32.8  30.0 - 36.0 g/dL   RDW 40.9  81.1 - 91.4 %   Platelets 249  150 - 400 K/uL   Neutrophils Relative % 58  43 - 77 %   Neutro Abs 3.7  1.7 - 7.7 K/uL   Lymphocytes Relative 33  12 - 46 %   Lymphs Abs 2.1  0.7 - 4.0 K/uL   Monocytes Relative 8  3 - 12 %   Monocytes Absolute 0.5  0.1 - 1.0 K/uL   Eosinophils Relative 1  0 - 5 %   Eosinophils Absolute 0.0  0.0 - 0.7 K/uL   Basophils Relative 0  0 - 1 %   Basophils Absolute 0.0  0.0 - 0.1 K/uL  BASIC METABOLIC PANEL      Result Value Range   Sodium 138  135 - 145 mEq/L   Potassium 3.7  3.5 - 5.1 mEq/L   Chloride 103  96 - 112 mEq/L   CO2 24  19 - 32 mEq/L   Glucose, Bld 103 (*) 70 - 99 mg/dL   BUN 11  6 - 23 mg/dL   Creatinine, Ser 7.82  0.50 - 1.10 mg/dL   Calcium 9.2  8.4 - 95.6 mg/dL   GFR calc non Af Amer >90  >90 mL/min   GFR calc Af Amer >90  >90 mL/min  URINALYSIS, ROUTINE W REFLEX MICROSCOPIC      Result Value Range   Color, Urine YELLOW  YELLOW   APPearance CLOUDY (*) CLEAR   Specific Gravity, Urine 1.020  1.005 - 1.030   pH 5.5  5.0 - 8.0   Glucose, UA NEGATIVE  NEGATIVE mg/dL   Hgb urine dipstick SMALL (*) NEGATIVE   Bilirubin Urine NEGATIVE  NEGATIVE   Ketones, ur NEGATIVE  NEGATIVE mg/dL   Protein, ur NEGATIVE  NEGATIVE mg/dL   Urobilinogen, UA 0.2  0.0 - 1.0 mg/dL   Nitrite NEGATIVE  NEGATIVE   Leukocytes, UA MODERATE (*) NEGATIVE  PREGNANCY, URINE      Result Value Range   Preg Test, Ur NEGATIVE  NEGATIVE  URINE MICROSCOPIC-ADD ON      Result Value Range   Squamous Epithelial / LPF RARE  RARE   WBC, UA 0-2  <3 WBC/hpf   RBC / HPF 3-6  <3 RBC/hpf   Bacteria, UA MANY (*) RARE   Urine-Other MUCOUS PRESENT     No results found.   Imaging Review No results found.  EKG Interpretation   None       MDM   1. UTI (lower urinary tract infection)   2. Pedal edema    Patient will be treated for her UTI. She is otherwise well-appearing.  She does have some mild peripheral edema but there is no shortness of breath  or signs of heart failure. Her electrolytes are unremarkable in her blood counts are normal. She was encouraged to followup with her primary care provider. She was encouraged to the metatarsal intake and elevate her legs.    Rolan Bucco, MD 11/07/13 1351

## 2013-11-07 NOTE — ED Notes (Signed)
Patient here with vertigo/dizziness for the past few days with position change. Patient also concerned with bilateral ankle and foot swelling x 2 days, reports that she does a lot of standing at work, also reports that her face and forehead red x 2 days, no itching

## 2013-11-16 ENCOUNTER — Encounter (HOSPITAL_BASED_OUTPATIENT_CLINIC_OR_DEPARTMENT_OTHER): Payer: Self-pay | Admitting: Emergency Medicine

## 2013-11-16 ENCOUNTER — Emergency Department (HOSPITAL_BASED_OUTPATIENT_CLINIC_OR_DEPARTMENT_OTHER): Payer: Medicaid Other

## 2013-11-16 ENCOUNTER — Emergency Department (HOSPITAL_BASED_OUTPATIENT_CLINIC_OR_DEPARTMENT_OTHER)
Admission: EM | Admit: 2013-11-16 | Discharge: 2013-11-16 | Disposition: A | Discharge status: Home / Self Care | Payer: Medicaid Other | Attending: Emergency Medicine | Admitting: Emergency Medicine

## 2013-11-16 DIAGNOSIS — R102 Pelvic and perineal pain: Secondary | ICD-10-CM

## 2013-11-16 DIAGNOSIS — N939 Abnormal uterine and vaginal bleeding, unspecified: Secondary | ICD-10-CM

## 2013-11-16 DIAGNOSIS — Z862 Personal history of diseases of the blood and blood-forming organs and certain disorders involving the immune mechanism: Secondary | ICD-10-CM | POA: Insufficient documentation

## 2013-11-16 DIAGNOSIS — Z9089 Acquired absence of other organs: Secondary | ICD-10-CM | POA: Insufficient documentation

## 2013-11-16 DIAGNOSIS — N898 Other specified noninflammatory disorders of vagina: Secondary | ICD-10-CM | POA: Insufficient documentation

## 2013-11-16 DIAGNOSIS — Z792 Long term (current) use of antibiotics: Secondary | ICD-10-CM | POA: Insufficient documentation

## 2013-11-16 DIAGNOSIS — Z3202 Encounter for pregnancy test, result negative: Secondary | ICD-10-CM | POA: Insufficient documentation

## 2013-11-16 LAB — COMPREHENSIVE METABOLIC PANEL
AST: 15 U/L (ref 0–37)
Albumin: 3.7 g/dL (ref 3.5–5.2)
Alkaline Phosphatase: 97 U/L (ref 39–117)
CO2: 25 mEq/L (ref 19–32)
Calcium: 9.1 mg/dL (ref 8.4–10.5)
Chloride: 104 mEq/L (ref 96–112)
Creatinine, Ser: 0.8 mg/dL (ref 0.50–1.10)
GFR calc non Af Amer: >90 mL/min (ref >90)
Potassium: 3.6 mEq/L (ref 3.5–5.1)
Total Bilirubin: 0.3 mg/dL (ref 0.3–1.2)

## 2013-11-16 LAB — URINE MICROSCOPIC-ADD ON

## 2013-11-16 LAB — CBC WITH DIFFERENTIAL/PLATELET
Basophils Absolute: 0 10*3/uL (ref 0.0–0.1)
Basophils Relative: 0 % (ref 0–1)
Eosinophils Absolute: 0.1 10*3/uL (ref 0.0–0.7)
Eosinophils Relative: 1 % (ref 0–5)
HCT: 36.7 % (ref 36.0–46.0)
Hemoglobin: 11.9 g/dL — ABNORMAL LOW (ref 12.0–15.0)
Lymphocytes Relative: 26 % (ref 12–46)
MCH: 27.7 pg (ref 26.0–34.0)
MCHC: 32.4 g/dL (ref 30.0–36.0)
MCV: 85.5 fL (ref 78.0–100.0)
Monocytes Absolute: 0.7 10*3/uL (ref 0.1–1.0)
Monocytes Relative: 8 % (ref 3–12)
Neutro Abs: 5.6 10*3/uL (ref 1.7–7.7)
Neutrophils Relative %: 66 % (ref 43–77)
Platelets: 275 10*3/uL (ref 150–400)
RDW: 14.1 % (ref 11.5–15.5)
WBC: 8.4 10*3/uL (ref 4.0–10.5)

## 2013-11-16 LAB — URINALYSIS, ROUTINE W REFLEX MICROSCOPIC
Bilirubin Urine: NEGATIVE
Glucose, UA: NEGATIVE mg/dL
Ketones, ur: NEGATIVE mg/dL
Leukocytes, UA: NEGATIVE
Nitrite: NEGATIVE
Urobilinogen, UA: 0.2 mg/dL (ref 0.0–1.0)
pH: 5.5 (ref 5.0–8.0)

## 2013-11-16 LAB — PREGNANCY, URINE: Preg Test, Ur: NEGATIVE

## 2013-11-16 LAB — WET PREP, GENITAL: Trich, Wet Prep: NONE SEEN

## 2013-11-16 MED ORDER — KETOROLAC TROMETHAMINE 30 MG/ML IJ SOLN
30.0000 mg | Freq: Once | INTRAMUSCULAR | Status: AC
  Administered 2013-11-16: 30 mg via INTRAVENOUS
  Filled 2013-11-16: qty 1

## 2013-11-16 MED ORDER — IOHEXOL 300 MG/ML  SOLN
100.0000 mL | Freq: Once | INTRAMUSCULAR | Status: AC | PRN
  Administered 2013-11-16: 100 mL via INTRAVENOUS

## 2013-11-16 MED ORDER — SODIUM CHLORIDE 0.9 % IV BOLUS (SEPSIS)
1000.0000 mL | Freq: Once | INTRAVENOUS | Status: AC
  Administered 2013-11-16: 1000 mL via INTRAVENOUS

## 2013-11-16 MED ORDER — IOHEXOL 300 MG/ML  SOLN
50.0000 mL | Freq: Once | INTRAMUSCULAR | Status: AC | PRN
  Administered 2013-11-16: 50 mL via ORAL

## 2013-11-16 MED ORDER — HYDROCODONE-ACETAMINOPHEN 5-325 MG PO TABS: 2.0000 | ORAL_TABLET | ORAL | Status: DC | PRN

## 2013-11-16 NOTE — ED Provider Notes (Signed)
CSN: [id number]     Arrival date & time 11/16/13  1216 History   This chart was scribed for Geoffery Lyons, MD by Clydene Laming, ED Scribe. This patient was seen in room MH05/MH05 and the patient's care was started at 12:49 PM.   Chief Complaint  Patient presents with  . Abdominal Pain   The history is provided by the patient. No language interpreter was used.   HPI Comments: Abigail Elliott is a 37 y.o. female who presents to the Emergency Department complaining of severe, lower abdominal pain onset at 3 AM today. Pt states this is her first episode and it feels like she is having contractions. Pt denies being pregnant . Normal bowel sounds no Pt also denies any discharge, diarrhea, constipation, dysuria or fever. Pt has a hx of cholecystectomy. She states her menstrual cycle began, ended, and began again. Pt is not on birth control medication.  Past Medical History  Diagnosis Date  . Anemia    Past Surgical History  Procedure Laterality Date  . Cholecystectomy     No family history on file. History  Substance Use Topics  . Smoking status: Never Smoker   . Smokeless tobacco: Never Used  . Alcohol Use: No   OB History   Grav Para Term Preterm Abortions TAB SAB Ect Mult Living                 Review of Systems  Constitutional: Negative for fever.  Gastrointestinal: Positive for abdominal pain.    Allergies  Review of patient's allergies indicates no known allergies.  Home Medications   Current Outpatient Rx  Name  Route  Sig  Dispense  Refill  . ciprofloxacin (CIPRO) 500 MG tablet   Oral   Take 1 tablet (500 mg total) by mouth 2 (two) times daily. One po bid x 7 days   14 tablet   0    BP 115/74  Pulse 75  Temp(Src) 98.3 F (36.8 C) (Oral)  Resp 18  Ht 5\' 4"  (1.626 m)  Wt 230 lb (104.327 kg)  BMI 39.46 kg/m2  SpO2 100%  LMP 11/16/2013 Physical Exam  Nursing note and vitals reviewed. Constitutional: She is oriented to person, place, and time. She appears  well-developed and well-nourished.  HENT:  Head: Normocephalic and atraumatic.  Right Ear: External ear normal.  Left Ear: External ear normal.  Nose: Nose normal.  Mouth/Throat: Oropharynx is clear and moist.  Eyes: Conjunctivae and EOM are normal. Pupils are equal, round, and reactive to light.  Neck: Normal range of motion. Neck supple.  Cardiovascular: Normal rate, regular rhythm, normal heart sounds and intact distal pulses.   Pulmonary/Chest: Effort normal and breath sounds normal. No respiratory distress. She has no wheezes.  Abdominal: Soft. Bowel sounds are normal. There is tenderness. There is no rebound and no guarding.  Tenderness to palpation across the lower abdomen in the suprapubic and right and left lower quadrants.  Genitourinary: Vagina normal. No vaginal discharge found.  There is slight dark vaginal bleeding present.  Musculoskeletal: Normal range of motion.  Neurological: She is alert and oriented to person, place, and time. No cranial nerve deficit or sensory deficit. Gait normal. GCS eye subscore is 4. GCS verbal subscore is 5. GCS motor subscore is 6.      Skin: Skin is warm and dry.    ED Course  Procedures (including critical care time) DIAGNOSTIC STUDIES: Oxygen Saturation is 100% on RA, normal by my interpretation.    COORDINATION  OF CARE: 12:57 PM- Discussed treatment plan with pt at bedside. Pt verbalized understanding and agreement with plan.   Labs Review Labs Reviewed  PREGNANCY, URINE  URINALYSIS, ROUTINE W REFLEX MICROSCOPIC   Imaging Review No results found.    MDM  No diagnosis found.  Patient is a 37 year old female presents to the emergency department with complaints of pain in the lower abdomen. She states this started about 3:00 this morning and woke her from sleep. She has had no fevers and no bowel or bladder complaints. She is having some mild vaginal bleeding and states that it is not that time in her cycle for this to be  happening. Physical examination reveals normal vital signs and tenderness in the lower abdomen. Laboratory studies are unremarkable. There is no elevation of white count and CT scan does not reveal any evidence for appendicitis. There is no evidence for urinary tract infection. Pelvic examination was performed which reveals slight dark blood, however was unremarkable otherwise. She appears appropriate for discharge. I will prescribe a small number of pain medication for her to take in the meantime.  I personally performed the services described in this documentation, which was scribed in my presence. The recorded information has been reviewed and is accurate.       Geoffery Lyons, MD 11/16/13 [phone]

## 2013-11-16 NOTE — ED Notes (Signed)
Pt reports she awakened with severe abdominal cramping this am.

## 2013-11-16 NOTE — ED Notes (Signed)
Pt is drinking PO contrast 

## 2013-11-16 NOTE — ED Notes (Signed)
Patient transported to CT 

## 2013-11-16 NOTE — ED Notes (Signed)
MD at bedside. 

## 2013-11-17 LAB — GC/CHLAMYDIA PROBE AMP
CT Probe RNA: NEGATIVE
GC Probe RNA: NEGATIVE

## 2015-08-02 ENCOUNTER — Encounter (HOSPITAL_BASED_OUTPATIENT_CLINIC_OR_DEPARTMENT_OTHER): Payer: Self-pay | Admitting: *Deleted

## 2015-08-02 ENCOUNTER — Emergency Department (HOSPITAL_BASED_OUTPATIENT_CLINIC_OR_DEPARTMENT_OTHER)
Admission: EM | Admit: 2015-08-02 | Discharge: 2015-08-02 | Disposition: A | Discharge status: Home / Self Care | Payer: Medicaid Other | Attending: Emergency Medicine | Admitting: Emergency Medicine

## 2015-08-02 DIAGNOSIS — X58XXXA Exposure to other specified factors, initial encounter: Secondary | ICD-10-CM | POA: Diagnosis not present

## 2015-08-02 DIAGNOSIS — Y92512 Supermarket, store or market as the place of occurrence of the external cause: Secondary | ICD-10-CM | POA: Diagnosis not present

## 2015-08-02 DIAGNOSIS — Z862 Personal history of diseases of the blood and blood-forming organs and certain disorders involving the immune mechanism: Secondary | ICD-10-CM | POA: Diagnosis not present

## 2015-08-02 DIAGNOSIS — J029 Acute pharyngitis, unspecified: Secondary | ICD-10-CM

## 2015-08-02 DIAGNOSIS — Y998 Other external cause status: Secondary | ICD-10-CM | POA: Insufficient documentation

## 2015-08-02 DIAGNOSIS — Y9389 Activity, other specified: Secondary | ICD-10-CM | POA: Diagnosis not present

## 2015-08-02 DIAGNOSIS — S6992XA Unspecified injury of left wrist, hand and finger(s), initial encounter: Secondary | ICD-10-CM | POA: Diagnosis present

## 2015-08-02 DIAGNOSIS — S63502A Unspecified sprain of left wrist, initial encounter: Secondary | ICD-10-CM | POA: Diagnosis not present

## 2015-08-02 NOTE — ED Notes (Signed)
Left wrist burning sensation after picking up pack of water. Woke with burning in her throat and spill bright green sputum. Burning in her throat.

## 2015-08-02 NOTE — ED Provider Notes (Signed)
CSN: [id number]     Arrival date & time 08/02/15  1948 History  This chart was scribe for Gwyneth Sprout, MD by Angelene Giovanni, ED Scribe. The patient was seen in room MH03/MH03 and the patient's care was started at 8:55 PM.      Chief Complaint  Patient presents with  . Wrist Injury   Patient is a 39 y.o. female presenting with wrist injury. The history is provided by the patient. No language interpreter was used.  Wrist Injury Location:  Wrist Time since incident:  10 hours Injury: yes   Wrist location:  L wrist Pain details:    Radiates to:  Does not radiate   Severity:  Moderate   Onset quality:  Sudden   Duration:  9 hours   Timing:  Constant   Progression:  Worsening Chronicity:  New Foreign body present:  No foreign bodies Relieved by:  None tried Worsened by:  Nothing tried Ineffective treatments:  None tried  HPI Comments: Abigail Elliott is a 39 y.o. female with a hx of Anemia who presents to the Emergency Department complaining of constant throat burning this am when she woke up around 11am. She denies a cough. She also denies any heart burn or eating right before going to bed. She reports that she went to the grocery store after waking up to get 28-bottled water pack (approx 10 pounds) and had left wrist pain while trying to it pick up. She explains that she heard a "pop" in her wrist. 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  No alleviating factors noted.   Past Medical History  Diagnosis Date  . Anemia    Past Surgical History  Procedure Laterality Date  . Cholecystectomy     No family history on file. Social History  Substance Use Topics  . Smoking status: Never Smoker   . Smokeless tobacco: Never Used  . Alcohol Use: No   OB History    No data available     Review of Systems    Allergies  Review of patient's allergies indicates no known allergies.  Home Medications   Prior to Admission  medications   Medication Sig Start Date End Date Taking? Authorizing Provider  ciprofloxacin (CIPRO) 500 MG tablet Take 1 tablet (500 mg total) by mouth 2 (two) times daily. One po bid x 7 days 11/07/13   Rolan Bucco, MD  HYDROcodone-acetaminophen (NORCO) 5-325 MG per tablet Take 2 tablets by mouth every 4 (four) hours as needed. 11/16/13   Geoffery Lyons, MD   BP 112/62 mmHg  Pulse 80  Temp(Src) 98.2 F (36.8 C) (Oral)  Resp 20  Ht 5\' 6"  (1.676 m)  Wt 243 lb (110.224 kg)  BMI 39.24 kg/m2  SpO2 99%  LMP 07/26/2015 Physical Exam  Constitutional: She is oriented to person, place, and time. She appears well-developed and well-nourished. No distress.  HENT:  Head: Normocephalic and atraumatic.  Mild erythema of pharynx  Eyes: Conjunctivae and EOM are normal.  Neck: Neck supple. No tracheal deviation present.  Cardiovascular: Normal rate.   Pulmonary/Chest: Effort normal. No respiratory distress.  Musculoskeletal: Normal range of motion. She exhibits tenderness.  Lwrist volar tenderness, midline No swelling or erythema 2+ radial pulses Normal sensation and movement of fingers ROM intact  Neurological: She is alert and oriented to person, place, and time.  Skin: Skin is warm and dry.  Psychiatric: She has a normal mood and affect. Her behavior  is normal.  Nursing note and vitals reviewed.   ED Course  Procedures (including critical care time) DIAGNOSTIC STUDIES: Oxygen Saturation is 99% on RA, normal by my interpretation.    COORDINATION OF CARE: 9:00 PM- Pt advised of plan for treatment and pt agrees.    Labs Review Labs Reviewed - No data to display  Imaging Review No results found. I have personally reviewed and evaluated these images and lab results as part of my medical decision-making.   EKG Interpretation None      MDM   Final diagnoses:  Sore throat  Wrist sprain, left, initial encounter    Patient with symptoms most consistent with a wrist sprain that  started after she attempted to lift a 28 pack of bottled water. She felt a pop in her wrist and since that time his high persistent volar wrist pain. No need for imaging at this time however feel that patient has most likely a wrist sprain. She was placed in a wrist splint to follow-up with sports medicine in 2 weeks if symptoms do not improve. Secondly she complained of a sore throat this morning and coughing up some green mucus. She denies any congestion at this time symptoms are not suggestive of GERD. Most likely viral in nature as patient does work at [employer] and has multiple sick contacts.  I personally performed the services described in this documentation, which was scribed in my presence.  The recorded information has been reviewed and considered.   Gwyneth Sprout, MD 08/02/15 [phone]

## 2015-08-02 NOTE — ED Notes (Signed)
Pt c/o acid reflux and throat burning since this morning as well as some mild wrist pain after picking up some bottled water at the store.  No swelling to left wrist appreciated, full ROM.

## 2015-12-07 ENCOUNTER — Emergency Department (HOSPITAL_BASED_OUTPATIENT_CLINIC_OR_DEPARTMENT_OTHER)
Admission: EM | Admit: 2015-12-07 | Discharge: 2015-12-07 | Disposition: A | Discharge status: Home / Self Care | Payer: Medicaid Other | Attending: Emergency Medicine | Admitting: Emergency Medicine

## 2015-12-07 ENCOUNTER — Encounter (HOSPITAL_BASED_OUTPATIENT_CLINIC_OR_DEPARTMENT_OTHER): Payer: Self-pay | Admitting: *Deleted

## 2015-12-07 DIAGNOSIS — Z3202 Encounter for pregnancy test, result negative: Secondary | ICD-10-CM | POA: Insufficient documentation

## 2015-12-07 DIAGNOSIS — R319 Hematuria, unspecified: Secondary | ICD-10-CM | POA: Diagnosis not present

## 2015-12-07 DIAGNOSIS — R109 Unspecified abdominal pain: Secondary | ICD-10-CM

## 2015-12-07 DIAGNOSIS — Z862 Personal history of diseases of the blood and blood-forming organs and certain disorders involving the immune mechanism: Secondary | ICD-10-CM | POA: Insufficient documentation

## 2015-12-07 DIAGNOSIS — Z87442 Personal history of urinary calculi: Secondary | ICD-10-CM | POA: Insufficient documentation

## 2015-12-07 DIAGNOSIS — R0781 Pleurodynia: Secondary | ICD-10-CM | POA: Insufficient documentation

## 2015-12-07 LAB — URINALYSIS, ROUTINE W REFLEX MICROSCOPIC
BILIRUBIN URINE: NEGATIVE
GLUCOSE, UA: NEGATIVE mg/dL
Ketones, ur: NEGATIVE mg/dL
Leukocytes, UA: NEGATIVE
Nitrite: NEGATIVE
PROTEIN: NEGATIVE mg/dL
Specific Gravity, Urine: 1.016 (ref 1.005–1.030)
pH: 5.5 (ref 5.0–8.0)

## 2015-12-07 LAB — URINE MICROSCOPIC-ADD ON

## 2015-12-07 LAB — PREGNANCY, URINE: PREG TEST UR: NEGATIVE

## 2015-12-07 MED ORDER — IBUPROFEN 600 MG PO TABS: 600.0000 mg | ORAL_TABLET | Freq: Four times a day (QID) | ORAL | Status: DC | PRN

## 2015-12-07 MED ORDER — CYCLOBENZAPRINE HCL 10 MG PO TABS: 10.0000 mg | ORAL_TABLET | Freq: Three times a day (TID) | ORAL | Status: DC | PRN

## 2015-12-07 NOTE — ED Provider Notes (Signed)
CSN: [id number]     Arrival date & time 12/07/15  1826 History  By signing my name below, I, Jarvis Morgan, attest that this documentation has been prepared under the direction and in the presence of No att. providers found. Electronically Signed: Jarvis Morgan, ED Scribe. 12/09/2015. 3:41 PM.    Chief Complaint  Patient presents with  . Flank Pain   The history is provided by the patient. No language interpreter was used.    HPI Comments: Abigail Elliott is a 40 y.o. female who presents to the Emergency Department complaining of constant, moderate, shooting, left flank pain onset 1 week. She denies any h/o similar pain. Pt endorses the pain radiates into her left ribs. Pt reports the pain is worse with twisting movements. Pt has not taken any medication prior to arrival. She denies any fall injuries. She denies any h/o kidney stones. Pt had a CT scan 2 years ago which showed no kidney stones. Pt denies any recent illness. Pt notes she always has trace amounts of blood in her urine and is unsure of the cause. She denies any nausea, vomiting, diarrhea, cough,, SOB, or urinary symptoms.  Past Medical History  Diagnosis Date  . Anemia    Past Surgical History  Procedure Laterality Date  . Cholecystectomy    . Tubal ligation     No family history on file. Social History  Substance Use Topics  . Smoking status: Never Smoker   . Smokeless tobacco: Never Used  . Alcohol Use: No   OB History    No data available     Review of Systems  Respiratory: Negative for cough and shortness of breath.   Gastrointestinal: Negative for nausea, vomiting and diarrhea.  Genitourinary: Positive for flank pain. Negative for dysuria, urgency, hematuria and difficulty urinating.  Musculoskeletal: Positive for arthralgias.      Allergies  Review of patient's allergies indicates no known allergies.  Home Medications   Prior to Admission medications   Medication Sig Start Date End Date Taking?  Authorizing Provider  ciprofloxacin (CIPRO) 500 MG tablet Take 1 tablet (500 mg total) by mouth 2 (two) times daily. One po bid x 7 days 11/07/13   Rolan Bucco, MD  cyclobenzaprine (FLEXERIL) 10 MG tablet Take 1 tablet (10 mg total) by mouth 3 (three) times daily as needed for muscle spasms. 12/07/15   Benjiman Core, MD  HYDROcodone-acetaminophen (NORCO) 5-325 MG per tablet Take 2 tablets by mouth every 4 (four) hours as needed. 11/16/13   Geoffery Lyons, MD  ibuprofen (ADVIL,MOTRIN) 600 MG tablet Take 1 tablet (600 mg total) by mouth every 6 (six) hours as needed. 12/07/15   Benjiman Core, MD   Triage Vitals: BP 113/72 mmHg  Pulse 82  Temp(Src) 98.3 F (36.8 C) (Oral)  Resp 18  Ht 5\' 7"  (1.702 m)  Wt 245 lb (111.131 kg)  BMI 38.36 kg/m2  SpO2 100%  LMP 11/27/2015  Physical Exam  Constitutional: She is oriented to person, place, and time. She appears well-developed and well-nourished. No distress.  HENT:  Head: Normocephalic and atraumatic.  Eyes: Conjunctivae and EOM are normal.  Neck: Neck supple. No tracheal deviation present.  Cardiovascular: Normal rate.   No peripheral edema  Pulmonary/Chest: Effort normal and breath sounds normal. No respiratory distress.  Abdominal: Soft. Bowel sounds are normal. There is no tenderness. There is no CVA tenderness.  Musculoskeletal: Normal range of motion.  Tenderness over left lower lateral ribs No crepitus or deformity  Neurological: She is  alert and oriented to person, place, and time.  Skin: Skin is warm and dry.  Psychiatric: She has a normal mood and affect. Her behavior is normal.  Nursing note and vitals reviewed.   ED Course  Procedures (including critical care time)  Results for orders placed or performed during the hospital encounter of 12/07/15  Urinalysis, Routine w reflex microscopic-may I&O cath if menses (not [facility])  Result Value Ref Range   Color, Urine YELLOW YELLOW   APPearance CLEAR CLEAR   Specific Gravity,  Urine 1.016 1.005 - 1.030   pH 5.5 5.0 - 8.0   Glucose, UA NEGATIVE NEGATIVE mg/dL   Hgb urine dipstick TRACE (A) NEGATIVE   Bilirubin Urine NEGATIVE NEGATIVE   Ketones, ur NEGATIVE NEGATIVE mg/dL   Protein, ur NEGATIVE NEGATIVE mg/dL   Nitrite NEGATIVE NEGATIVE   Leukocytes, UA NEGATIVE NEGATIVE  Pregnancy, urine  Result Value Ref Range   Preg Test, Ur NEGATIVE NEGATIVE  Urine microscopic-add on  Result Value Ref Range   Squamous Epithelial / LPF 0-5 (A) NONE SEEN   WBC, UA 0-5 0 - 5 WBC/hpf   RBC / HPF 0-5 0 - 5 RBC/hpf   Bacteria, UA RARE (A) NONE SEEN   No results found.   Imaging Review No results found. I have personally reviewed and evaluated these lab results as part of my medical decision-making.   EKG Interpretation None      MDM   Final diagnoses:  Flank pain    Patient with flank pain. Likely musculoskeletal. Always had some hematuria. Previous records reviewed. Will discharge home. I personally performed the services described in this documentation, which was scribed in my presence. The recorded information has been reviewed and is accurate.       Benjiman CoreNathan Mckinsey Keagle, MD 12/09/15 [phone]

## 2015-12-07 NOTE — ED Notes (Signed)
Left flank pain x 1 week- denies falls and injury- denies illness- states "it feels like someone is pounding me in my ribs

## 2015-12-07 NOTE — Discharge Instructions (Signed)
Flank Pain °Flank pain refers to pain that is located on the side of the body between the upper abdomen and the back. The pain may occur over a short period of time (acute) or may be long-term or reoccurring (chronic). It may be mild or severe. Flank pain can be caused by many things. °CAUSES  °Some of the more common causes of flank pain include: °· Muscle strains.   °· Muscle spasms.   °· A disease of your spine (vertebral disk disease).   °· A lung infection (pneumonia).   °· Fluid around your lungs (pulmonary edema).   °· A kidney infection.   °· Kidney stones.   °· A very painful skin rash caused by the chickenpox virus (shingles).   °· Gallbladder disease.   °HOME CARE INSTRUCTIONS  °Home care will depend on the cause of your pain. In general, °· Rest as directed by your caregiver. °· Drink enough fluids to keep your urine clear or pale yellow. °· Only take over-the-counter or prescription medicines as directed by your caregiver. Some medicines may help relieve the pain. °· Tell your caregiver about any changes in your pain. °· Follow up with your caregiver as directed. °SEEK IMMEDIATE MEDICAL CARE IF:  °· Your pain is not controlled with medicine.   °· You have new or worsening symptoms. °· Your pain increases.   °· You have abdominal pain.   °· You have shortness of breath.   °· You have persistent nausea or vomiting.   °· You have swelling in your abdomen.   °· You feel faint or pass out.   °· You have blood in your urine. °· You have a fever or persistent symptoms for more than 2-3 days. °· You have a fever and your symptoms suddenly get worse. °MAKE SURE YOU:  °· Understand these instructions. °· Will watch your condition. °· Will get help right away if you are not doing well or get worse. °  °This information is not intended to replace advice given to you by your health care provider. Make sure you discuss any questions you have with your health care provider. °  °Document Released: 01/10/2006 Document  Revised: 08/13/2012 Document Reviewed: 07/03/2012 °Elsevier Interactive Patient Education ©2016 [employer] ° °

## 2015-12-07 NOTE — ED Notes (Signed)
C/o left flank pain x 1 week,  Pain increased w movement  Denies urinary diff  Pt texting entire time rn was talking to her

## 2016-08-28 ENCOUNTER — Encounter (HOSPITAL_BASED_OUTPATIENT_CLINIC_OR_DEPARTMENT_OTHER): Payer: Self-pay | Admitting: Emergency Medicine

## 2016-08-28 ENCOUNTER — Emergency Department (HOSPITAL_BASED_OUTPATIENT_CLINIC_OR_DEPARTMENT_OTHER)
Admission: EM | Admit: 2016-08-28 | Discharge: 2016-08-28 | Disposition: A | Discharge status: Home / Self Care | Payer: Medicaid Other | Attending: Emergency Medicine | Admitting: Emergency Medicine

## 2016-08-28 DIAGNOSIS — M25561 Pain in right knee: Secondary | ICD-10-CM

## 2016-08-28 DIAGNOSIS — M25562 Pain in left knee: Secondary | ICD-10-CM | POA: Insufficient documentation

## 2016-08-28 MED ORDER — NAPROXEN 500 MG PO TABS: 500.0000 mg | ORAL_TABLET | Freq: Two times a day (BID) | ORAL | 0 refills | Status: DC

## 2016-08-28 NOTE — ED Triage Notes (Signed)
Pt states she started having pain in knees last night and feet are burning.  Denies any injury.  Pt ambulates to triage without assistance needed.
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
## 2017-03-12 ENCOUNTER — Emergency Department (HOSPITAL_BASED_OUTPATIENT_CLINIC_OR_DEPARTMENT_OTHER): Payer: Medicaid Other

## 2017-03-12 ENCOUNTER — Emergency Department (HOSPITAL_BASED_OUTPATIENT_CLINIC_OR_DEPARTMENT_OTHER)
Admission: EM | Admit: 2017-03-12 | Discharge: 2017-03-12 | Disposition: A | Discharge status: Home / Self Care | Payer: Medicaid Other | Attending: Emergency Medicine | Admitting: Emergency Medicine

## 2017-03-12 ENCOUNTER — Encounter (HOSPITAL_BASED_OUTPATIENT_CLINIC_OR_DEPARTMENT_OTHER): Payer: Self-pay | Admitting: *Deleted

## 2017-03-12 DIAGNOSIS — R197 Diarrhea, unspecified: Secondary | ICD-10-CM | POA: Insufficient documentation

## 2017-03-12 DIAGNOSIS — R51 Headache: Secondary | ICD-10-CM | POA: Diagnosis not present

## 2017-03-12 DIAGNOSIS — R112 Nausea with vomiting, unspecified: Secondary | ICD-10-CM

## 2017-03-12 DIAGNOSIS — R1031 Right lower quadrant pain: Secondary | ICD-10-CM | POA: Insufficient documentation

## 2017-03-12 DIAGNOSIS — R5383 Other fatigue: Secondary | ICD-10-CM | POA: Insufficient documentation

## 2017-03-12 DIAGNOSIS — R630 Anorexia: Secondary | ICD-10-CM | POA: Insufficient documentation

## 2017-03-12 DIAGNOSIS — R109 Unspecified abdominal pain: Secondary | ICD-10-CM

## 2017-03-12 LAB — URINALYSIS, ROUTINE W REFLEX MICROSCOPIC
Bilirubin Urine: NEGATIVE
GLUCOSE, UA: NEGATIVE mg/dL
Ketones, ur: NEGATIVE mg/dL
Nitrite: NEGATIVE
PROTEIN: NEGATIVE mg/dL
SPECIFIC GRAVITY, URINE: 1.014 (ref 1.005–1.030)
pH: 5 (ref 5.0–8.0)

## 2017-03-12 LAB — CBC WITH DIFFERENTIAL/PLATELET
BASOS PCT: 0 %
Basophils Absolute: 0 10*3/uL (ref 0.0–0.1)
EOS ABS: 0.1 10*3/uL (ref 0.0–0.7)
EOS PCT: 1 %
HCT: 33.3 % — ABNORMAL LOW (ref 36.0–46.0)
HEMOGLOBIN: 10.6 g/dL — AB (ref 12.0–15.0)
LYMPHS ABS: 2.3 10*3/uL (ref 0.7–4.0)
Lymphocytes Relative: 23 %
MCH: 24.5 pg — AB (ref 26.0–34.0)
MCHC: 31.8 g/dL (ref 30.0–36.0)
MCV: 77.1 fL — ABNORMAL LOW (ref 78.0–100.0)
Monocytes Absolute: 0.8 10*3/uL (ref 0.1–1.0)
Monocytes Relative: 8 %
NEUTROS PCT: 68 %
Neutro Abs: 6.8 10*3/uL (ref 1.7–7.7)
PLATELETS: 320 10*3/uL (ref 150–400)
RBC: 4.32 MIL/uL (ref 3.87–5.11)
RDW: 16.4 % — ABNORMAL HIGH (ref 11.5–15.5)
WBC: 9.9 10*3/uL (ref 4.0–10.5)

## 2017-03-12 LAB — BASIC METABOLIC PANEL
Anion gap: 6 (ref 5–15)
BUN: 11 mg/dL (ref 6–20)
CALCIUM: 8.5 mg/dL — AB (ref 8.9–10.3)
CHLORIDE: 106 mmol/L (ref 101–111)
CO2: 23 mmol/L (ref 22–32)
CREATININE: 0.8 mg/dL (ref 0.44–1.00)
Glucose, Bld: 98 mg/dL (ref 65–99)
Potassium: 3.5 mmol/L (ref 3.5–5.1)
SODIUM: 135 mmol/L (ref 135–145)

## 2017-03-12 LAB — URINALYSIS, MICROSCOPIC (REFLEX)

## 2017-03-12 LAB — PREGNANCY, URINE: Preg Test, Ur: NEGATIVE

## 2017-03-12 MED ORDER — ONDANSETRON HCL 4 MG/2ML IJ SOLN
4.0000 mg | Freq: Once | INTRAMUSCULAR | Status: AC
  Administered 2017-03-12: 4 mg via INTRAVENOUS
  Filled 2017-03-12: qty 2

## 2017-03-12 MED ORDER — IOPAMIDOL (ISOVUE-300) INJECTION 61%
100.0000 mL | Freq: Once | INTRAVENOUS | Status: AC | PRN
  Administered 2017-03-12: 100 mL via INTRAVENOUS

## 2017-03-12 MED ORDER — SODIUM CHLORIDE 0.9 % IV BOLUS (SEPSIS)
1000.0000 mL | Freq: Once | INTRAVENOUS | Status: AC
  Administered 2017-03-12: 1000 mL via INTRAVENOUS

## 2017-03-12 MED ORDER — ONDANSETRON 4 MG PO TBDP: 4.0000 mg | ORAL_TABLET | Freq: Three times a day (TID) | ORAL | 0 refills | Status: DC | PRN

## 2017-03-12 NOTE — ED Notes (Signed)
Pt verbalizes understanding of d/c instructions and denies any further need at this time. 

## 2017-03-12 NOTE — ED Provider Notes (Signed)
[facility]   CSN: [id number] Arrival date & time: 03/12/17  1709     History   Chief Complaint Chief Complaint  Patient presents with  . Abdominal Pain  . Emesis  . Diarrhea    HPI Abigail Elliott is a 41 y.o. female.  HPI Patient presents with lower abdomen for the last day or 2. It is dull. Has had vomiting diarrhea. No blood in the emesis or stool. No fevers. Has headaches. Has had a decreased appetite. No dysuria. No sick contacts. Pain is dull. She denies possibly pregnancy.   Past Medical History:  Diagnosis Date  . Anemia     There are no active problems to display for this patient.   Past Surgical History:  Procedure Laterality Date  . CHOLECYSTECTOMY    . TUBAL LIGATION      OB History    No data available       Home Medications    Prior to Admission medications   Medication Sig Start Date End Date Taking? Authorizing Provider  naproxen (NAPROSYN) 500 MG tablet Take 1 tablet (500 mg total) by mouth 2 (two) times daily with a meal. 08/28/16   Everlene Farrier, PA-C  ondansetron (ZOFRAN-ODT) 4 MG disintegrating tablet Take 1 tablet (4 mg total) by mouth every 8 (eight) hours as needed for nausea or vomiting. 03/12/17   Benjiman Core, MD    Family History No family history on file.  Social History Social History  Substance Use Topics  . Smoking status: Never Smoker  . Smokeless tobacco: Never Used  . Alcohol use No     Allergies   Patient has no known allergies.   Review of Systems Review of Systems  Constitutional: Positive for appetite change and fatigue.  HENT: Negative for congestion.   Respiratory: Negative for shortness of breath.   Cardiovascular: Negative for chest pain.  Gastrointestinal: Positive for abdominal pain, diarrhea, nausea and vomiting.  Genitourinary: Negative for enuresis and hematuria.  Musculoskeletal: Negative for myalgias.  Skin: Negative for rash.  Neurological: Negative for  numbness.  Hematological: Negative for adenopathy.  Psychiatric/Behavioral: Negative for behavioral problems.     Physical Exam Updated Vital Signs BP 95/67 (BP Location: Left Arm)   Pulse 74   Temp 98.3 F (36.8 C) (Oral)   Resp 18   Ht  (1.676 m)   Wt 245 lb (111.1 kg)   LMP 02/15/2017   SpO2 100%   BMI 39.54 kg/m   Physical Exam  Constitutional: She appears well-developed.  HENT:  Head: Atraumatic.  Eyes: Pupils are equal, round, and reactive to light.  Neck: Neck supple.  Cardiovascular: Normal rate.   Pulmonary/Chest: Effort normal.  Abdominal: Soft. There is tenderness.  Tenderness in right lower quadrant without rebound or guarding. No mass.  Musculoskeletal: She exhibits no edema or tenderness.  Neurological: She is alert.  Skin: Skin is warm. Capillary refill takes less than 2 seconds.  Psychiatric: She has a normal mood and affect.     ED Treatments / Results  Labs (all labs ordered are listed, but only abnormal results are displayed) Labs Reviewed  URINALYSIS, ROUTINE W REFLEX MICROSCOPIC - Abnormal; Notable for the following:       Result Value   APPearance CLOUDY (*)    Hgb urine dipstick SMALL (*)    Leukocytes, UA SMALL (*)    All other components within normal limits  BASIC METABOLIC PANEL - Abnormal; Notable for the following:  Calcium 8.5 (*)    All other components within normal limits  CBC WITH DIFFERENTIAL/PLATELET - Abnormal; Notable for the following:    Hemoglobin 10.6 (*)    HCT 33.3 (*)    MCV 77.1 (*)    MCH 24.5 (*)    RDW 16.4 (*)    All other components within normal limits  URINALYSIS, MICROSCOPIC (REFLEX) - Abnormal; Notable for the following:    Bacteria, UA FEW (*)    Squamous Epithelial / LPF 0-5 (*)    All other components within normal limits  PREGNANCY, URINE    EKG  EKG Interpretation None       Radiology Ct Abdomen Pelvis W Contrast  Result Date: 03/12/2017 CLINICAL DATA:  Lower abdominal pain,  emesis and diarrhea over the last 2 days. EXAM: CT ABDOMEN AND PELVIS WITH CONTRAST TECHNIQUE: Multidetector CT imaging of the abdomen and pelvis was performed using the standard protocol following bolus administration of intravenous contrast. CONTRAST:  ISOVUE-300 IOPAMIDOL (ISOVUE-300) INJECTION 61% COMPARISON:  03/17/2015 FINDINGS: Lower chest: Normal Hepatobiliary: Liver parenchyma is normal. Previous cholecystectomy. Pancreas: Normal Spleen: Normal Adrenals/Urinary Tract: Adrenal glands are normal. Kidneys are normal except for a 1 cm cyst on the right. Bladder is normal. Stomach/Bowel: No acute bowel finding. No evidence of ileus or obstruction. No evidence of inflammatory process. Appendix appears normal. Vascular/Lymphatic: Normal Reproductive: Normal Other: No free fluid or air Musculoskeletal: Normal IMPRESSION: No abnormality to explain the presenting symptoms. Electronically Signed   By: Paulina Fusi M.D.   On: 03/12/2017 20:00    Procedures Procedures (including critical care time)  Medications Ordered in ED Medications  sodium chloride 0.9 % bolus 1,000 mL (0 mLs Intravenous Stopped 03/12/17 1839)  ondansetron (ZOFRAN) injection 4 mg (4 mg Intravenous Given 03/12/17 1750)  sodium chloride 0.9 % bolus 1,000 mL (0 mLs Intravenous Stopped 03/12/17 2013)  iopamidol (ISOVUE-300) 61 % injection 100 mL (100 mLs Intravenous Contrast Given 03/12/17 1939)     Initial Impression / Assessment and Plan / ED Course  I have reviewed the triage vital signs and the nursing notes.  Pertinent labs & imaging results that were available during my care of the patient were reviewed by me and considered in my medical decision making (see chart for details).     Nausea vomiting diarrhea and lower abdominal pain. Moderate tenderness. Due to continued tenderness after fluids CT scan done and was reassuring. He'll somewhat better after treatment after that. Has had 2 L of fluid and will discharge  home.  Final Clinical Impressions(s) / ED Diagnoses   Final diagnoses:  Nausea vomiting and diarrhea  Abdominal pain, unspecified abdominal location    New Prescriptions New Prescriptions   ONDANSETRON (ZOFRAN-ODT) 4 MG DISINTEGRATING TABLET    Take 1 tablet (4 mg total) by mouth every 8 (eight) hours as needed for nausea or vomiting.     Benjiman Core, MD 03/12/17 2111

## 2017-03-12 NOTE — ED Triage Notes (Signed)
Generalized pain in her lower abdomen x 2 days. Burning feeling with vomiting and diarrhea.

## 2017-06-11 DIAGNOSIS — K645 Perianal venous thrombosis: Secondary | ICD-10-CM | POA: Insufficient documentation

## 2017-06-21 ENCOUNTER — Encounter (HOSPITAL_BASED_OUTPATIENT_CLINIC_OR_DEPARTMENT_OTHER): Payer: Self-pay | Admitting: *Deleted

## 2017-06-21 ENCOUNTER — Emergency Department (HOSPITAL_BASED_OUTPATIENT_CLINIC_OR_DEPARTMENT_OTHER)
Admission: EM | Admit: 2017-06-21 | Discharge: 2017-06-21 | Disposition: A | Discharge status: Home / Self Care | Payer: Medicaid Other | Attending: Emergency Medicine | Admitting: Emergency Medicine

## 2017-06-21 DIAGNOSIS — Z79899 Other long term (current) drug therapy: Secondary | ICD-10-CM | POA: Diagnosis not present

## 2017-06-21 DIAGNOSIS — H6501 Acute serous otitis media, right ear: Secondary | ICD-10-CM | POA: Insufficient documentation

## 2017-06-21 DIAGNOSIS — H6123 Impacted cerumen, bilateral: Secondary | ICD-10-CM | POA: Diagnosis not present

## 2017-06-21 DIAGNOSIS — H9203 Otalgia, bilateral: Secondary | ICD-10-CM | POA: Diagnosis present

## 2017-06-21 MED ORDER — CETIRIZINE HCL 10 MG PO TABS: 10.0000 mg | ORAL_TABLET | Freq: Every day | ORAL | 0 refills | Status: DC

## 2017-06-21 MED ORDER — AMOXICILLIN 500 MG PO CAPS: 500.0000 mg | ORAL_CAPSULE | Freq: Three times a day (TID) | ORAL | 0 refills | Status: DC

## 2017-06-21 MED ORDER — DOCUSATE SODIUM 50 MG/5ML PO LIQD
ORAL | Status: AC
  Filled 2017-06-21: qty 10

## 2017-06-21 NOTE — ED Notes (Signed)
ED Provider at bedside. 

## 2017-06-21 NOTE — ED Notes (Signed)
Bile ars irrigated with large amount of wax removed

## 2017-06-21 NOTE — ED Triage Notes (Signed)
Pt c/o bil ear pain x 1 week 

## 2017-06-21 NOTE — ED Provider Notes (Signed)
[facility]-EMERGENCY DEPT Provider Note   CSN: [id number] Arrival date & time: 06/21/17  2002 By signing my name below, I, Levon Hedger, attest that this documentation has been prepared under the direction and in the presence of Jerelyn Scott, MD . Electronically Signed: Levon Hedger, Scribe. 06/21/2017. 10:14 PM.   History   Chief Complaint Chief Complaint  Patient presents with  . Otalgia    bil    HPI Abigail Elliott is a 41 y.o. female who presents to the Emergency Department complaining of decreased hearing to bilateral ears onset one week ago. Pt states "I feel like my head is underwater". She has tried popping her ears with no relief. Pt also notes associated bilateral ear discharge. No OTC treatments tried for these symptoms PTA.  Pt has never experienced this before. She denies any otalgia and has no other acute complaints at this time.   The history is provided by the patient. No language interpreter was used.   Past Medical History:  Diagnosis Date  . Anemia     There are no active problems to display for this patient.   Past Surgical History:  Procedure Laterality Date  . CHOLECYSTECTOMY    . TUBAL LIGATION      OB History    No data available       Home Medications    Prior to Admission medications   Medication Sig Start Date End Date Taking? Authorizing Provider  amoxicillin (AMOXIL) 500 MG capsule Take 1 capsule (500 mg total) by mouth 3 (three) times daily. 06/21/17   Jerelyn Scott, MD  cetirizine (ZYRTEC ALLERGY) 10 MG tablet Take 1 tablet (10 mg total) by mouth daily. 06/21/17   Jerelyn Scott, MD  naproxen (NAPROSYN) 500 MG tablet Take 1 tablet (500 mg total) by mouth 2 (two) times daily with a meal. 08/28/16   Everlene Farrier, PA-C  ondansetron (ZOFRAN-ODT) 4 MG disintegrating tablet Take 1 tablet (4 mg total) by mouth every 8 (eight) hours as needed for nausea or vomiting. 03/12/17   Benjiman Core, MD    Family History History reviewed. No  pertinent family history.  Social History Social History  Substance Use Topics  . Smoking status: Never Smoker  . Smokeless tobacco: Never Used  . Alcohol use No     Allergies   Patient has no known allergies.   Review of Systems Review of Systems  Constitutional: Negative for fever.  HENT: Positive for ear discharge and hearing loss. Negative for ear pain.   All other systems reviewed and are negative.   Physical Exam Updated Vital Signs BP 129/86   Pulse 84   Temp 98.2 F (36.8 C) (Oral)   Resp 16   Ht 5\' 5"  (1.651 m)   Wt 114.3 kg (252 lb)   LMP 06/07/2017   SpO2 100%   BMI 41.93 kg/m  Vitals reviewed Physical Exam Physical Examination: General appearance - alert, well appearing, and in no distress Mental status - alert, oriented to person, place, and time Eyes - no conjunctival injection, no scleral icterus Ears - cerumen impaction bilaterally, after irrigation, left EAC normal, right EAC clear- right TM with fluid behind and decreased landmarks Mouth - mucous membranes moist, pharynx normal without lesions Neck - supple, no significant adenopathy Chest - normal respiratory effort Neurological - alert, oriented, normal speech, no focal findings or movement disorder noted Extremities - peripheral pulses normal, no pedal edema, no clubbing or cyanosis Skin - normal coloration and turgor, no rashes  ED  Treatments / Results  DIAGNOSTIC STUDIES:  Oxygen Saturation is 100% on RA, normal by my interpretation.    COORDINATION OF CARE:  10:12 PM Will flush ears. Discussed treatment plan with pt at bedside and pt agreed to plan.   Labs (all labs ordered are listed, but only abnormal results are displayed) Labs Reviewed - No data to display  EKG  EKG Interpretation None       Radiology No results found.  Procedures Procedures (including critical care time)  Medications Ordered in ED Medications - No data to display   Initial Impression /  Assessment and Plan / ED Course  I have reviewed the triage vital signs and the nursing notes.  Pertinent labs & imaging results that were available during my care of the patient were reviewed by me and considered in my medical decision making (see chart for details).     Pt presenting with bilateral cerumen impaction, after irrigation by RN, right TM with fluid behind c/w serous OM.  Pt started on abx, antihistamine.  Discharged with strict return precautions.  Pt agreeable with plan.  Final Clinical Impressions(s) / ED Diagnoses   Final diagnoses:  Bilateral impacted cerumen  Right acute serous otitis media, recurrence not specified    New Prescriptions Discharge Medication List as of 06/21/2017 11:24 PM    START taking these medications   Details  amoxicillin (AMOXIL) 500 MG capsule Take 1 capsule (500 mg total) by mouth 3 (three) times daily., Starting Fri 06/21/2017, Print    cetirizine (ZYRTEC ALLERGY) 10 MG tablet Take 1 tablet (10 mg total) by mouth daily., Starting Fri 06/21/2017, Print        I personally performed the services described in this documentation, which was scribed in my presence. The recorded information has been reviewed and is accurate.     Jerelyn ScottLinker, Hiba Garry, MD 06/23/17 [phone]

## 2017-06-21 NOTE — Discharge Instructions (Signed)
Return to the ED with any concerns including increased ear pain, headache, vomiting and not able to keep down liquids or antibiotics, decreased level of alertness/lethargy, or any other alarming symptoms

## 2018-01-27 DIAGNOSIS — Z6841 Body Mass Index (BMI) 40.0 and over, adult: Secondary | ICD-10-CM

## 2018-02-12 DIAGNOSIS — D509 Iron deficiency anemia, unspecified: Secondary | ICD-10-CM | POA: Insufficient documentation

## 2018-06-28 ENCOUNTER — Other Ambulatory Visit: Payer: Self-pay

## 2018-06-28 ENCOUNTER — Encounter (HOSPITAL_BASED_OUTPATIENT_CLINIC_OR_DEPARTMENT_OTHER): Payer: Self-pay | Admitting: Emergency Medicine

## 2018-06-28 ENCOUNTER — Emergency Department (HOSPITAL_BASED_OUTPATIENT_CLINIC_OR_DEPARTMENT_OTHER)
Admission: EM | Admit: 2018-06-28 | Discharge: 2018-06-28 | Disposition: A | Discharge status: Home / Self Care | Payer: Medicaid Other | Attending: Emergency Medicine | Admitting: Emergency Medicine

## 2018-06-28 ENCOUNTER — Emergency Department (HOSPITAL_BASED_OUTPATIENT_CLINIC_OR_DEPARTMENT_OTHER): Payer: Medicaid Other

## 2018-06-28 DIAGNOSIS — Y929 Unspecified place or not applicable: Secondary | ICD-10-CM | POA: Insufficient documentation

## 2018-06-28 DIAGNOSIS — Z79899 Other long term (current) drug therapy: Secondary | ICD-10-CM | POA: Insufficient documentation

## 2018-06-28 DIAGNOSIS — M7062 Trochanteric bursitis, left hip: Secondary | ICD-10-CM | POA: Insufficient documentation

## 2018-06-28 DIAGNOSIS — T23029A Burn of unspecified degree of unspecified single finger (nail) except thumb, initial encounter: Secondary | ICD-10-CM

## 2018-06-28 DIAGNOSIS — Y939 Activity, unspecified: Secondary | ICD-10-CM | POA: Diagnosis not present

## 2018-06-28 DIAGNOSIS — X153XXA Contact with hot saucepan or skillet, initial encounter: Secondary | ICD-10-CM | POA: Diagnosis not present

## 2018-06-28 DIAGNOSIS — T23021A Burn of unspecified degree of single right finger (nail) except thumb, initial encounter: Secondary | ICD-10-CM | POA: Diagnosis not present

## 2018-06-28 DIAGNOSIS — Y999 Unspecified external cause status: Secondary | ICD-10-CM | POA: Diagnosis not present

## 2018-06-28 DIAGNOSIS — R5383 Other fatigue: Secondary | ICD-10-CM | POA: Insufficient documentation

## 2018-06-28 DIAGNOSIS — S6991XA Unspecified injury of right wrist, hand and finger(s), initial encounter: Secondary | ICD-10-CM | POA: Diagnosis present

## 2018-06-28 LAB — CBC
HCT: 35.1 % — ABNORMAL LOW (ref 36.0–46.0)
Hemoglobin: 11.5 g/dL — ABNORMAL LOW (ref 12.0–15.0)
MCH: 28.1 pg (ref 26.0–34.0)
MCHC: 32.8 g/dL (ref 30.0–36.0)
MCV: 85.8 fL (ref 78.0–100.0)
Platelets: 271 10*3/uL (ref 150–400)
RBC: 4.09 MIL/uL (ref 3.87–5.11)
RDW: 14.2 % (ref 11.5–15.5)
WBC: 6.2 10*3/uL (ref 4.0–10.5)

## 2018-06-28 LAB — BASIC METABOLIC PANEL
Anion gap: 9 (ref 5–15)
BUN: 8 mg/dL (ref 6–20)
CO2: 23 mmol/L (ref 22–32)
Calcium: 8.4 mg/dL — ABNORMAL LOW (ref 8.9–10.3)
Chloride: 106 mmol/L (ref 98–111)
Creatinine, Ser: 0.74 mg/dL (ref 0.44–1.00)
GFR calc Af Amer: >60 mL/min (ref >60)
GFR calc non Af Amer: >60 mL/min (ref >60)
Glucose, Bld: 108 mg/dL — ABNORMAL HIGH (ref 70–99)
Potassium: 3.6 mmol/L (ref 3.5–5.1)
Sodium: 138 mmol/L (ref 135–145)

## 2018-06-28 MED ORDER — CEPHALEXIN 500 MG PO CAPS: 1000.0000 mg | ORAL_CAPSULE | Freq: Two times a day (BID) | ORAL | 0 refills | Status: DC

## 2018-06-28 MED ORDER — NAPROXEN 375 MG PO TABS: 375.0000 mg | ORAL_TABLET | Freq: Two times a day (BID) | ORAL | 0 refills | Status: DC | PRN

## 2018-06-28 NOTE — ED Notes (Signed)
Pt refused urine pregnancy test for xrays, signed waiver, scanned into epic

## 2018-06-28 NOTE — ED Provider Notes (Signed)
[facility] EMERGENCY DEPARTMENT Provider Note   CSN: [id number] Arrival date & time: 06/28/18  1333     History   Chief Complaint Chief Complaint  Patient presents with  . Hip Pain  . Burn  . Fatigue    HPI Abigail Elliott is a 42 y.o. female.  HPI   42 year old female with multiple complaints.  She has been having intermittent left hip pain for almost a week.  She denies any acute trauma.  She describes pain over the lateral aspect of her hip.  Worse with ambulation.  She has it at rest as well but it is better.  No numbness or tingling.  No swelling.  She is also complaining of a burn to her right ring finger.  She reports burning on a hot pan 3 days ago.  Initially blister then popped.  She now has an abraded area she concerned that it may be getting infected.  She reports that she had clear drainage when the blister initially popped but none since then.  No fevers or chills.  She has been putting antibiotic ointment on it.  She also has been having generalized fatigue.  She is concerned that she may be anemic.  She reports a history of iron deficiency C anemia with periodic iron transfusions.  She reports compliance with her oral iron supplementation.  Past Medical History:  Diagnosis Date  . Anemia     There are no active problems to display for this patient.   Past Surgical History:  Procedure Laterality Date  . CHOLECYSTECTOMY    . TUBAL LIGATION       OB History   None      Home Medications    Prior to Admission medications   Medication Sig Start Date End Date Taking? Authorizing Provider  amoxicillin (AMOXIL) 500 MG capsule Take 1 capsule (500 mg total) by mouth 3 (three) times daily. 06/21/17   Mabe, Latanya Maudlin, MD  cetirizine (ZYRTEC ALLERGY) 10 MG tablet Take 1 tablet (10 mg total) by mouth daily. 06/21/17   Mabe, Latanya Maudlin, MD  naproxen (NAPROSYN) 500 MG tablet Take 1 tablet (500 mg total) by mouth 2 (two) times daily with a meal. 08/28/16    Everlene Farrier, PA-C  ondansetron (ZOFRAN-ODT) 4 MG disintegrating tablet Take 1 tablet (4 mg total) by mouth every 8 (eight) hours as needed for nausea or vomiting. 03/12/17   Benjiman Core, MD    Family History No family history on file.  Social History Social History   Tobacco Use  . Smoking status: Never Smoker  . Smokeless tobacco: Never Used  Substance Use Topics  . Alcohol use: No  . Drug use: No     Allergies   Patient has no known allergies.   Review of Systems Review of Systems  All systems reviewed and negative, other than as noted in HPI.  Physical Exam Updated Vital Signs BP (!) 112/59 (BP Location: Left Arm)   Pulse 68   Temp 98.4 F (36.9 C) (Oral)   Resp 18   Ht 5\' 7"  (1.702 m)   Wt 118.8 kg (262 lb)   LMP 06/17/2018 Comment: BTL  SpO2 99%   BMI 41.04 kg/m   Physical Exam  Constitutional: She appears well-developed and well-nourished. No distress.  HENT:  Head: Normocephalic and atraumatic.  Eyes: Conjunctivae are normal. Right eye exhibits no discharge. Left eye exhibits no discharge.  Neck: Neck supple.  Cardiovascular: Normal rate, regular rhythm and normal heart  sounds. Exam reveals no gallop and no friction rub.  No murmur heard. Pulmonary/Chest: Effort normal and breath sounds normal. No respiratory distress.  Abdominal: Soft. She exhibits no distension. There is no tenderness.  Musculoskeletal: She exhibits no edema or tenderness.  Mild tenderness to palpation over the left greater trochanter.  No overlying skin changes.  She can actively range the left hip full full range of motion without any apparent difficulty.  No appreciable swelling in the lower extremities.  She is neurovascularly intact.  She has a small abraded/scabbed area over the dorsal aspect of her right ring finger in between the DIP and the PIP she can actively range these joints.  There is no drainage.  There is some mild periwound erythema.  No appreciable swelling.    Neurological: She is alert.  Skin: Skin is warm and dry.  Psychiatric: She has a normal mood and affect. Her behavior is normal. Thought content normal.  Nursing note and vitals reviewed.    ED Treatments / Results  Labs (all labs ordered are listed, but only abnormal results are displayed) Labs Reviewed  BASIC METABOLIC PANEL - Abnormal; Notable for the following components:      Result Value   Glucose, Bld 108 (*)    Calcium 8.4 (*)    All other components within normal limits  CBC - Abnormal; Notable for the following components:   Hemoglobin 11.5 (*)    HCT 35.1 (*)    All other components within normal limits    EKG None  Radiology Dg Hip Unilat W Or Wo Pelvis 2-3 Views Left  Result Date: 06/28/2018 CLINICAL DATA:  42 year old female with a history of left hip pain for 4 days EXAM: DG HIP (WITH OR WITHOUT PELVIS) 2-3V LEFT COMPARISON:  None. FINDINGS: Bony pelvic ring intact. No acute fracture line identified. Bilateral hips projects normally over the acetabula. Unremarkable visualized proximal femurs. No significant hip degenerative changes. IMPRESSION: Negative for acute bony abnormality Electronically Signed   By: Gilmer MorJaime  Wagner D.O.   On: 06/28/2018 14:24    Procedures Procedures (including critical care time)  Medications Ordered in ED Medications - No data to display   Initial Impression / Assessment and Plan / ED Course  I have reviewed the triage vital signs and the nursing notes.  Pertinent labs & imaging results that were available during my care of the patient were reviewed by me and considered in my medical decision making (see chart for details).     42 year old female with atraumatic left hip pain.  She had negative plain films.  I suspect this may be a trochanteric bursitis.  PRN NSAIDs.  Wound care for her finger burn.  She may potentially be developing mild cellulitis but I suspect that this is more likely routine healing.  She feels like the mild  redness around the wound is new so we will put her on a course of oral antibiotics though.  Continued wound care and return precautions were discussed.  Not sure the exact etiology of her generalized fatigue.  She is not significantly anemic.  She is afebrile.  Nontoxic.  I doubt emergent process.  Final Clinical Impressions(s) / ED Diagnoses   Final diagnoses:  Trochanteric bursitis of left hip  Burn of finger, unspecified burn degree, unspecified laterality, initial encounter    ED Discharge Orders    None       Raeford RazorKohut, Laelia Angelo, MD 06/28/18 1554

## 2018-06-28 NOTE — ED Triage Notes (Addendum)
L hip pain x 1 week. Denies injury. Also has a burn to R ring finger several days ago that she would like evaluated. She also wants to have blood drawn due to "severe anemia" and feeling fatigued x 3 days.

## 2018-11-23 ENCOUNTER — Emergency Department (HOSPITAL_BASED_OUTPATIENT_CLINIC_OR_DEPARTMENT_OTHER): Payer: Medicaid Other

## 2018-11-23 ENCOUNTER — Other Ambulatory Visit: Payer: Self-pay

## 2018-11-23 ENCOUNTER — Emergency Department (HOSPITAL_BASED_OUTPATIENT_CLINIC_OR_DEPARTMENT_OTHER)
Admission: EM | Admit: 2018-11-23 | Discharge: 2018-11-23 | Disposition: A | Discharge status: Home / Self Care | Payer: Medicaid Other | Attending: Emergency Medicine | Admitting: Emergency Medicine

## 2018-11-23 ENCOUNTER — Encounter (HOSPITAL_BASED_OUTPATIENT_CLINIC_OR_DEPARTMENT_OTHER): Payer: Self-pay | Admitting: Emergency Medicine

## 2018-11-23 DIAGNOSIS — B9789 Other viral agents as the cause of diseases classified elsewhere: Secondary | ICD-10-CM

## 2018-11-23 DIAGNOSIS — J069 Acute upper respiratory infection, unspecified: Secondary | ICD-10-CM

## 2018-11-23 DIAGNOSIS — R69 Illness, unspecified: Secondary | ICD-10-CM

## 2018-11-23 DIAGNOSIS — Z79899 Other long term (current) drug therapy: Secondary | ICD-10-CM | POA: Insufficient documentation

## 2018-11-23 DIAGNOSIS — R05 Cough: Secondary | ICD-10-CM | POA: Diagnosis present

## 2018-11-23 DIAGNOSIS — J111 Influenza due to unidentified influenza virus with other respiratory manifestations: Secondary | ICD-10-CM

## 2018-11-23 MED ORDER — ACETAMINOPHEN 500 MG PO TABS
1000.0000 mg | ORAL_TABLET | Freq: Once | ORAL | Status: AC
  Administered 2018-11-23: 1000 mg via ORAL
  Filled 2018-11-23: qty 2

## 2018-11-23 MED ORDER — HYDROCOD POLST-CPM POLST ER 10-8 MG/5ML PO SUER
5.0000 mL | Freq: Once | ORAL | Status: AC
  Administered 2018-11-23: 5 mL via ORAL
  Filled 2018-11-23: qty 5

## 2018-11-23 MED ORDER — IBUPROFEN 800 MG PO TABS
800.0000 mg | ORAL_TABLET | Freq: Once | ORAL | Status: AC
  Administered 2018-11-23: 800 mg via ORAL
  Filled 2018-11-23: qty 1

## 2018-11-23 MED ORDER — PREDNISONE 50 MG PO TABS: 50.0000 mg | ORAL_TABLET | Freq: Every day | ORAL | 0 refills | Status: DC

## 2018-11-23 MED ORDER — HYDROCODONE-HOMATROPINE 5-1.5 MG/5ML PO SYRP: 5.0000 mL | ORAL_SOLUTION | ORAL | 0 refills | Status: DC | PRN

## 2018-11-23 MED ORDER — GUAIFENESIN ER 1200 MG PO TB12: 1.0000 | ORAL_TABLET | Freq: Two times a day (BID) | ORAL | 0 refills | Status: DC

## 2018-11-23 NOTE — Discharge Instructions (Signed)
Return here as needed.  Increase your fluid intake.  Rest as much as possible.  Tylenol and Motrin for any fever.

## 2018-11-23 NOTE — ED Triage Notes (Signed)
Reports cough without fever x 5 days.

## 2018-11-23 NOTE — ED Provider Notes (Signed)
[facility] EMERGENCY DEPARTMENT Provider Note   CSN: [id number] Arrival date & time: 11/23/18  1940     History   Chief Complaint Chief Complaint  Patient presents with  . Cough    HPI Abigail Elliott is a 42 y.o. female.  HPI Patient presents to the emergency department with cough nasal congestion body aches and chills.  The patient states this is been ongoing over the last 5 days.  Patient states that she has some increased fatigue since being ill.  Patient states she took some LawyerTessalon Perles with no relief of her symptoms.  Patient states nothing seems make the condition better.  Patient ports decreased appetite.  The patient denies chest pain, shortness of breath, headache,blurred vision, neck pain,  numbness, dizziness, anorexia, edema, abdominal pain, nausea, vomiting, diarrhea, rash, back pain, dysuria, hematemesis, bloody stool, near syncope, or syncope. Past Medical History:  Diagnosis Date  . Anemia     There are no active problems to display for this patient.   Past Surgical History:  Procedure Laterality Date  . CHOLECYSTECTOMY    . TUBAL LIGATION       OB History   No obstetric history on file.      Home Medications    Prior to Admission medications   Medication Sig Start Date End Date Taking? Authorizing Provider  amoxicillin (AMOXIL) 500 MG capsule Take 1 capsule (500 mg total) by mouth 3 (three) times daily. 06/21/17   Mabe, Latanya MaudlinMartha L, MD  cephALEXin (KEFLEX) 500 MG capsule Take 2 capsules (1,000 mg total) by mouth 2 (two) times daily. 06/28/18   Raeford RazorKohut, Stephen, MD  cetirizine (ZYRTEC ALLERGY) 10 MG tablet Take 1 tablet (10 mg total) by mouth daily. 06/21/17   Mabe, Latanya MaudlinMartha L, MD  naproxen (NAPROSYN) 375 MG tablet Take 1 tablet (375 mg total) by mouth 2 (two) times daily as needed for moderate pain. 06/28/18   Raeford RazorKohut, Stephen, MD  naproxen (NAPROSYN) 500 MG tablet Take 1 tablet (500 mg total) by mouth 2 (two) times daily with a meal. 08/28/16    Everlene Farrieransie, William, PA-C  ondansetron (ZOFRAN-ODT) 4 MG disintegrating tablet Take 1 tablet (4 mg total) by mouth every 8 (eight) hours as needed for nausea or vomiting. 03/12/17   Benjiman CorePickering, Nathan, MD    Family History History reviewed. No pertinent family history.  Social History Social History   Tobacco Use  . Smoking status: Never Smoker  . Smokeless tobacco: Never Used  Substance Use Topics  . Alcohol use: No  . Drug use: No     Allergies   Patient has no known allergies.   Review of Systems Review of Systems All other systems negative except as documented in the HPI. All pertinent positives and negatives as reviewed in the HPI.  Physical Exam Updated Vital Signs BP 109/71   Pulse (!) 110   Temp 100.1 F (37.8 C) (Oral)   Resp (!) 22   Ht 5\' 7"  (1.702 m)   Wt 113.9 kg   LMP 11/21/2018   SpO2 97%   BMI 39.31 kg/m   Physical Exam Vitals signs and nursing note reviewed.  Constitutional:      General: She is not in acute distress.    Appearance: Normal appearance. She is well-developed.  HENT:     Head: Normocephalic and atraumatic.     Right Ear: Tympanic membrane normal.     Left Ear: Tympanic membrane normal.     Mouth/Throat:     Mouth:  Mucous membranes are moist.  Eyes:     Pupils: Pupils are equal, round, and reactive to light.  Neck:     Musculoskeletal: Normal range of motion and neck supple.  Cardiovascular:     Rate and Rhythm: Regular rhythm. Tachycardia present.     Pulses: Normal pulses.     Heart sounds: Normal heart sounds. No murmur. No friction rub. No gallop.   Pulmonary:     Effort: Pulmonary effort is normal. No respiratory distress.     Breath sounds: Normal breath sounds. No wheezing.  Abdominal:     General: Bowel sounds are normal. There is no distension.     Palpations: Abdomen is soft.     Tenderness: There is no abdominal tenderness.  Skin:    General: Skin is warm and dry.     Findings: No erythema or rash.  Neurological:      Mental Status: She is alert and oriented to person, place, and time.     Motor: No abnormal muscle tone.     Coordination: Coordination normal.  Psychiatric:        Behavior: Behavior normal.      ED Treatments / Results  Labs (all labs ordered are listed, but only abnormal results are displayed) Labs Reviewed - No data to display  EKG None  Radiology Dg Chest 2 View  Result Date: 11/23/2018 CLINICAL DATA:  5 day history of cough. EXAM: CHEST - 2 VIEW COMPARISON:  11/29/2017. FINDINGS: The lungs are clear without focal pneumonia, edema, pneumothorax or pleural effusion. The cardiopericardial silhouette is within normal limits for size. The visualized bony structures of the thorax are intact. IMPRESSION: No active cardiopulmonary disease. Electronically Signed   By: Kennith CenterEric  Mansell M.D.   On: 11/23/2018 20:05    Procedures Procedures (including critical care time)  Medications Ordered in ED Medications  chlorpheniramine-HYDROcodone (TUSSIONEX) 10-8 MG/5ML suspension 5 mL (has no administration in time range)     Initial Impression / Assessment and Plan / ED Course  I have reviewed the triage vital signs and the nursing notes.  Pertinent labs & imaging results that were available during my care of the patient were reviewed by me and considered in my medical decision making (see chart for details).     She most likely has an influenza-like illness.  I have advised her to increase her fluid intake and rest as much as possible.  Patient agrees the plan and all questions were answered. Final Clinical Impressions(s) / ED Diagnoses   Final diagnoses:  None    ED Discharge Orders    None       Kyra MangesLawyer, Jenai Scaletta, PA-C 11/23/18 2215    Sabas SousBero, Michael M, MD 11/24/18 0021

## 2018-11-23 NOTE — ED Notes (Signed)
Pt c/o cough with sinus and chest congestion. Pt state she has "not felt well" x 5 days, and states she has poor appetite. Pt also states that she feels like she needs to vomit r/t coughing. Pt states she is anemic. Pt appears tired, states she feels weak.

## 2019-01-05 ENCOUNTER — Encounter (HOSPITAL_BASED_OUTPATIENT_CLINIC_OR_DEPARTMENT_OTHER): Payer: Self-pay | Admitting: *Deleted

## 2019-01-05 ENCOUNTER — Emergency Department (HOSPITAL_BASED_OUTPATIENT_CLINIC_OR_DEPARTMENT_OTHER): Payer: Medicaid Other

## 2019-01-05 ENCOUNTER — Emergency Department (HOSPITAL_BASED_OUTPATIENT_CLINIC_OR_DEPARTMENT_OTHER)
Admission: EM | Admit: 2019-01-05 | Discharge: 2019-01-05 | Disposition: A | Discharge status: Home / Self Care | Payer: Medicaid Other | Attending: Emergency Medicine | Admitting: Emergency Medicine

## 2019-01-05 ENCOUNTER — Other Ambulatory Visit: Payer: Self-pay

## 2019-01-05 DIAGNOSIS — Z91040 Latex allergy status: Secondary | ICD-10-CM | POA: Insufficient documentation

## 2019-01-05 DIAGNOSIS — Z79899 Other long term (current) drug therapy: Secondary | ICD-10-CM | POA: Diagnosis not present

## 2019-01-05 DIAGNOSIS — M25562 Pain in left knee: Secondary | ICD-10-CM | POA: Insufficient documentation

## 2019-01-05 MED ORDER — KETOROLAC TROMETHAMINE 15 MG/ML IJ SOLN
30.0000 mg | Freq: Once | INTRAMUSCULAR | Status: AC
  Administered 2019-01-05: 30 mg via INTRAMUSCULAR
  Filled 2019-01-05: qty 2

## 2019-01-05 NOTE — ED Notes (Signed)
NAD at this time. Pt is stable and going home.  
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
## 2019-01-05 NOTE — Discharge Instructions (Signed)
It was my pleasure taking care of you today!   Alternate between Tylenol and Ibuprofen as needed for pain. Ice and elevate knee throughout the day.  Call the sports medicine doctor listed today or tomorrow to schedule follow up appointment.  Return to the ER for new or worsening symptoms, any additional concerns.

## 2019-01-09 ENCOUNTER — Encounter

## 2019-01-12 ENCOUNTER — Ambulatory Visit: Payer: BLUE CROSS/BLUE SHIELD | Admitting: Family Medicine

## 2019-01-12 ENCOUNTER — Encounter: Payer: Self-pay | Admitting: Family Medicine

## 2019-01-12 VITALS — BP 110/78 | HR 104 | Ht 67.0 in | Wt 252.0 lb

## 2019-01-12 DIAGNOSIS — M25562 Pain in left knee: Secondary | ICD-10-CM

## 2019-01-12 MED ORDER — MELOXICAM 15 MG PO TABS: 15.0000 mg | ORAL_TABLET | Freq: Every day | ORAL | 1 refills | Status: AC

## 2019-01-12 MED ORDER — METHYLPREDNISOLONE ACETATE 40 MG/ML IJ SUSP
40.0000 mg | Freq: Once | INTRAMUSCULAR | Status: AC
  Administered 2019-01-12: 40 mg via INTRA_ARTICULAR

## 2019-01-12 NOTE — Patient Instructions (Addendum)
Your exam and ultrasound are reassuring. This is consistent with a flare of very mild arthritis vs irritation of your lateral meniscus. Both are treated similarly. You were given an injection today. Meloxicam for pain and inflammation - don't take aleve or ibuprofen with this. Sleeve or brace may help for support but I wouldn't spend a lot of money on this. Icing 15 minutes at a time as needed. Knee extensions and straight leg raises 3 sets of 10 once a day. Follow up with me in 4 weeks (or as needed if doing well).

## 2019-01-12 NOTE — Progress Notes (Signed)
PCP: [facility]  Subjective:   HPI: Patient is a 43 y.o. female here for left knee pain.  Patient states her pain began approximately 3 weeks ago but denies any known injury.  She reports 6/10 sharp pain mostly over the lateral joint line.  Pain is worse when changing positions in bed or going upstairs.  Improved with rest.  She has been taking Tylenol and ibuprofen which has not been helpful.  She denies any swelling or erythema.  No skin changes.  No distal numbness or tingling.  No locking or giving out.  Past Medical History:  Diagnosis Date  . Anemia     Current Outpatient Medications on File Prior to Visit  Medication Sig Dispense Refill  . amoxicillin (AMOXIL) 500 MG capsule Take 1 capsule (500 mg total) by mouth 3 (three) times daily. 21 capsule 0  . cephALEXin (KEFLEX) 500 MG capsule Take 2 capsules (1,000 mg total) by mouth 2 (two) times daily. 28 capsule 0  . cetirizine (ZYRTEC ALLERGY) 10 MG tablet Take 1 tablet (10 mg total) by mouth daily. 30 tablet 0  . Guaifenesin 1200 MG TB12 Take 1 tablet (1,200 mg total) by mouth 2 (two) times daily. 20 each 0  . HYDROcodone-homatropine (HYCODAN) 5-1.5 MG/5ML syrup Take 5 mLs by mouth every 4 (four) hours as needed for cough. 240 mL 0  . naproxen (NAPROSYN) 375 MG tablet Take 1 tablet (375 mg total) by mouth 2 (two) times daily as needed for moderate pain. 20 tablet 0  . naproxen (NAPROSYN) 500 MG tablet Take 1 tablet (500 mg total) by mouth 2 (two) times daily with a meal. 30 tablet 0  . ondansetron (ZOFRAN-ODT) 4 MG disintegrating tablet Take 1 tablet (4 mg total) by mouth every 8 (eight) hours as needed for nausea or vomiting. 10 tablet 0  . predniSONE (DELTASONE) 50 MG tablet Take 1 tablet (50 mg total) by mouth daily with breakfast. 5 tablet 0   No current facility-administered medications on file prior to visit.     Past Surgical History:  Procedure Laterality Date  . CHOLECYSTECTOMY    . TUBAL LIGATION       Allergies  Allergen Reactions  . Grass Extracts [Gramineae Pollens]   . Latex     Social History   Socioeconomic History  . Marital status: Single    Spouse name: Not on file  . Number of children: Not on file  . Years of education: Not on file  . Highest education level: Not on file  Occupational History  . Not on file  Social Needs  . Financial resource strain: Not on file  . Food insecurity:    Worry: Not on file    Inability: Not on file  . Transportation needs:    Medical: Not on file    Non-medical: Not on file  Tobacco Use  . Smoking status: Never Smoker  . Smokeless tobacco: Never Used  Substance and Sexual Activity  . Alcohol use: No  . Drug use: No  . Sexual activity: Yes    Birth control/protection: Surgical  Lifestyle  . Physical activity:    Days per week: Not on file    Minutes per session: Not on file  . Stress: Not on file  Relationships  . Social connections:    Talks on phone: Not on file    Gets together: Not on file    Attends religious service: Not on file    Active member of club or  organization: Not on file    Attends meetings of clubs or organizations: Not on file    Relationship status: Not on file  . Intimate partner violence:    Fear of current or ex partner: Not on file    Emotionally abused: Not on file    Physically abused: Not on file    Forced sexual activity: Not on file  Other Topics Concern  . Not on file  Social History Narrative  . Not on file    No family history on file.  BP 110/78   Pulse (!) 104   Ht 5\' 7"  (1.702 m)   Wt 252 lb (114.3 kg)   LMP 12/21/2018 (Approximate)   BMI 39.47 kg/m   Review of Systems: See HPI above.     Objective:  Physical Exam:  Gen: awake, alert, NAD, comfortable in exam room Pulm: breathing unlabored  Left knee: - Inspection: no gross deformity. No swelling/effusion, erythema or bruising. Skin intact - Palpation: Tenderness over the medial and lateral joint line.  No  tenderness over the patella.  No tenderness on patellar tendon - ROM: full active ROM with flexion and extension in knee - Strength: 5/5 strength - Neuro/vasc: NV intact - Special Tests: - LIGAMENTS: negative anterior and posterior drawer, negative Lachman's, no MCL or LCL laxity  -- MENISCUS: Lateral joint line pain with McMurray's -- PF JOINT: nml patellar mobility bilaterally without pain or apprehension  Right knee: - Inspection: no gross deformity. No swelling/effusion, erythema or bruising. Skin intact - Palpation: Mild medial joint line tenderness - ROM: full active ROM with flexion and extension in knee  - Strength: 5/5 strength - Neuro/vasc: NV intact  [facility] [place]: Limited ultrasound of the left knee.  No significant bony spurring.  No joint effusion.  Medial lateral meniscus demonstrate no focal edema or tears.   Assessment & Plan:  1.  Left knee pain- likely secondary to mild arthritis vs meniscal irritation. -Steroid injection performed today - Consider knee bracing - Ice - meloxicam  - Follow-up in 4 weeks as needed  Procedure performed: knee intraarticular corticosteroid injection; palpation guided  Consent obtained and verified. Time-out conducted. Noted no overlying erythema, induration, or other signs of local infection. The left medial joint space was palpated and marked. The overlying skin was prepped in a sterile fashion. Topical analgesic spray: Ethyl chloride. Joint: LEFT knee Needle: 25ga, 1.5" Completed without difficulty. Meds: depomedrol 40mg , bupivicaine 3cc.

## 2019-01-13 ENCOUNTER — Encounter: Payer: Self-pay | Admitting: Family Medicine

## 2019-02-09 ENCOUNTER — Ambulatory Visit: Payer: Medicaid Other | Admitting: Family Medicine

## 2019-08-04 ENCOUNTER — Other Ambulatory Visit: Payer: Self-pay

## 2019-08-04 ENCOUNTER — Encounter (HOSPITAL_BASED_OUTPATIENT_CLINIC_OR_DEPARTMENT_OTHER): Payer: Self-pay

## 2019-08-04 ENCOUNTER — Emergency Department (HOSPITAL_BASED_OUTPATIENT_CLINIC_OR_DEPARTMENT_OTHER)
Admission: EM | Admit: 2019-08-04 | Discharge: 2019-08-04 | Disposition: A | Discharge status: Home / Self Care | Payer: BLUE CROSS/BLUE SHIELD | Attending: Emergency Medicine | Admitting: Emergency Medicine

## 2019-08-04 DIAGNOSIS — Z79899 Other long term (current) drug therapy: Secondary | ICD-10-CM | POA: Diagnosis not present

## 2019-08-04 DIAGNOSIS — M549 Dorsalgia, unspecified: Secondary | ICD-10-CM

## 2019-08-04 DIAGNOSIS — Z91040 Latex allergy status: Secondary | ICD-10-CM | POA: Diagnosis not present

## 2019-08-04 DIAGNOSIS — M546 Pain in thoracic spine: Secondary | ICD-10-CM | POA: Insufficient documentation

## 2019-08-04 DIAGNOSIS — M545 Low back pain: Secondary | ICD-10-CM | POA: Diagnosis not present

## 2019-08-04 MED ORDER — OXYCODONE-ACETAMINOPHEN 5-325 MG PO TABS
1.0000 | ORAL_TABLET | Freq: Once | ORAL | Status: AC
  Administered 2019-08-04: 18:00:00 1 via ORAL
  Filled 2019-08-04: qty 1

## 2019-08-04 MED ORDER — NAPROXEN 500 MG PO TABS: 500.0000 mg | ORAL_TABLET | Freq: Two times a day (BID) | ORAL | 0 refills | Status: DC

## 2019-08-04 MED ORDER — METHOCARBAMOL 500 MG PO TABS: 500.0000 mg | ORAL_TABLET | Freq: Three times a day (TID) | ORAL | 0 refills | Status: AC | PRN

## 2019-08-04 MED ORDER — LIDOCAINE 5 % EX PTCH: 1.0000 | MEDICATED_PATCH | CUTANEOUS | 0 refills | Status: AC

## 2019-08-04 NOTE — Discharge Instructions (Signed)
You were seen in the emergency department for back pain today.  At this time we suspect that your pain is related to a muscle strain/spasm.   I have prescribed you an anti-inflammatory medication and a muscle relaxer.  - Naproxen is a nonsteroidal anti-inflammatory medication that will help with pain and swelling. Be sure to take this medication as prescribed with food, 1 pill every 12 hours,  It should be taken with food, as it can cause stomach upset, and more seriously, stomach bleeding. Do not take other nonsteroidal anti-inflammatory medications with this such as Advil, Motrin, Aleve, Mobic, Goodie Powder, or Motrin.    - Robaxin is the muscle relaxer I have prescribed, this is meant to help with muscle tightness. Be aware that this medication may make you drowsy therefore the first time you take this it should be at a time you are in an environment where you can rest. Do not drive or operate heavy machinery when taking this medication. Do not drink alcohol or take other sedating medications with this medicine such as narcotics or benzodiazepines.   - Lidoderm patch-is a topical patch to help soothe the area, please apply directly to area with significant pain. You make take Tylenol per over the counter dosing with these medications.   We have prescribed you new medication(s) today. Discuss the medications prescribed today with your pharmacist as they can have adverse effects and interactions with your other medicines including over the counter and prescribed medications. Seek medical evaluation if you start to experience new or abnormal symptoms after taking one of these medicines, seek care immediately if you start to experience difficulty breathing, feeling of your throat closing, facial swelling, or rash as these could be indications of a more serious allergic reaction   The application of heat can help soothe the pain.  Maintaining your daily activities, including walking, is encourged, as it  will help you get better faster than just staying in bed.  Your pain should get better over the next 2 weeks.  You will need to follow up with  Your primary healthcare provider in 1-2 weeks for reassessment, if you do not have a primary care provider one is provided in your discharge instructions- you may see the [place] clinic or call the provided phone number. However return to the ER should you develop ne or worsening symptoms or any other concerns including but not limited to severe or worsening pain, low back pain with fever, numbness, weakness, loss of bowel or bladder control, or inability to walk or urinate, you should return to the ER immediately.

## 2019-08-04 NOTE — ED Triage Notes (Signed)
Pt c/o "upper and lower" back pain x 3-4 days-denies injury-NAD-slow gait

## 2019-08-04 NOTE — ED Provider Notes (Signed)
[facility] EMERGENCY DEPARTMENT Provider Note   CSN: [id number] Arrival date & time: 08/04/19  1718     History   Chief Complaint Chief Complaint  Patient presents with  . Back Pain    HPI Abigail Elliott is a 43 y.o. female w/ a hx of cholecystectomy, tubal ligation, anemia, & obesity who presents to the ED w/ complaints of back pain x 4 days. Patient states pain is to the diffuse mid and lower back.  Pain is intermittent, worse with twisting/turning motions, no alleviating factors.  She has tried icy hot and ibuprofen without relief.  She cannot recall a specific traumatic injury.  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 Denies numbness, tingling, weakness, saddle anesthesia, incontinence to bowel/bladder, fever, chills, IV drug use, dysuria, or hx of cancer. Patient has not had prior back surgeries. Denies chance of pregnancy.      HPI  Past Medical History:  Diagnosis Date  . Anemia     Patient Active Problem List   Diagnosis Date Noted  . Iron deficiency anemia 02/12/2018  . Class 3 severe obesity due to excess calories with body mass index (BMI) of 40.0 to 44.9 in adult (HCC) 01/27/2018  . Hemorrhoids, external, thrombosed 06/11/2017    Past Surgical History:  Procedure Laterality Date  . CHOLECYSTECTOMY    . TUBAL LIGATION       OB History   No obstetric history on file.      Home Medications    Prior to Admission medications   Medication Sig Start Date End Date Taking? Authorizing Provider  meloxicam (MOBIC) 15 MG tablet Take 1 tablet (15 mg total) by mouth daily. 01/12/19   Lenda KelpHudnall, Shane R, MD    Family History No family history on file.  Social History Social History   Tobacco Use  . Smoking status: Never Smoker  . Smokeless tobacco: Never Used  Substance Use Topics  . Alcohol use: No  . Drug use: No     Allergies   Grass extracts [gramineae pollens] and Latex   Review of Systems Review of  Systems  Constitutional: Negative for chills and fever.  Respiratory: Negative for shortness of breath.   Cardiovascular: Negative for chest pain.  Gastrointestinal: Negative for abdominal pain.  Genitourinary: Negative for dysuria and hematuria.  Musculoskeletal: Positive for back pain.  Neurological: Negative for weakness and numbness.       Negative for incontinence or saddle anesthesia.     Physical Exam Updated Vital Signs BP 109/84 (BP Location: Left Arm)   Pulse 82   Temp 98.7 F (37.1 C) (Oral)   Resp 18   Ht 5\' 5"  (1.651 m)   Wt 117.9 kg   LMP 08/04/2019   SpO2 98%   BMI 43.27 kg/m   Physical Exam Constitutional:      General: She is not in acute distress.    Appearance: She is well-developed. She is not toxic-appearing.  HENT:     Head: Normocephalic and atraumatic.  Neck:     Musculoskeletal: Normal range of motion and neck supple. No spinous process tenderness or muscular tenderness.  Abdominal:     General: There is no distension.     Palpations: Abdomen is soft.     Tenderness: There is no abdominal tenderness.  Musculoskeletal:     Comments: No obvious deformity, appreciable swelling, erythema, ecchymosis, significant open wounds, or increased warmth.  Extremities: Normal ROM. Nontender.  Back: No  point/focal vertebral tenderness, no palpable step off or crepitus.  Patient diffusely tender throughout the lower thoracic and the diffuse lumbar region including midline and bilateral paraspinal muscles.  Skin:    General: Skin is warm and dry.     Findings: No rash.  Neurological:     Mental Status: She is alert.     Deep Tendon Reflexes:     Reflex Scores:      Patellar reflexes are 2+ on the right side and 2+ on the left side.    Comments: Sensation grossly intact to bilateral lower extremities. 5/5 symmetric strength with plantar/dorsiflexion bilaterally. Gait is intact without obvious foot drop.     ED Treatments / Results  Labs (all labs ordered  are listed, but only abnormal results are displayed) Labs Reviewed - No data to display  EKG None  Radiology No results found.  Procedures Procedures (including critical care time)  Medications Ordered in ED Medications  oxyCODONE-acetaminophen (PERCOCET/ROXICET) 5-325 MG per tablet 1 tablet (has no administration in time range)     Initial Impression / Assessment and Plan / ED Course  I have reviewed the triage vital signs and the nursing notes.  Pertinent labs & imaging results that were available during my care of the patient were reviewed by me and considered in my medical decision making (see chart for details).    Patient presents with complaint of back pain.  Patient is nontoxic appearing, vitals are WNL. Patient has normal neurologic exam, no point/focal midline tenderness to palpation. she is ambulatory in the ED.  No back pain red flags. No urinary sxs. Most likely muscle strain versus spasm- diffuse tenderness throughout the thoracic/lumbar region. Considered disc disease, UTI/pyelonephritis, kidney stone, aortic aneurysm/dissection, cauda equina or epidural abscess however feel these do not fit clinical picture at this time. Will treat with Lidoderm patches, Naproxen, and Robaxin, discussed with patient that they are not to drive or operate heavy machinery while taking Robaxin. I discussed treatment plan, need for PCP follow-up, and return precautions with the patient. Provided opportunity for questions, patient confirmed understanding and is in agreement with plan.   Final Clinical Impressions(s) / ED Diagnoses   Final diagnoses:  Acute bilateral back pain, unspecified back location    ED Discharge Orders         Ordered    naproxen (NAPROSYN) 500 MG tablet  2 times daily     08/04/19 1750    methocarbamol (ROBAXIN) 500 MG tablet  Every 8 hours PRN     08/04/19 1750    lidocaine (LIDODERM) 5 %  Every 24 hours     08/04/19 1750           , [employer] R,  PA-C 08/04/19 Dunbar, Dan, DO 08/04/19 1955

## 2019-08-04 NOTE — ED Notes (Addendum)
Pt c/o back pain x 6 days. 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 Pt also reports shoulder pain. Pt states "it feels like a bruise on my back, its tender to touch". Pt denies otc medications pta

## 2019-08-06 ENCOUNTER — Ambulatory Visit (INDEPENDENT_AMBULATORY_CARE_PROVIDER_SITE_OTHER): Payer: BLUE CROSS/BLUE SHIELD | Admitting: Family Medicine

## 2019-08-06 ENCOUNTER — Other Ambulatory Visit: Payer: Self-pay

## 2019-08-06 ENCOUNTER — Encounter: Payer: Self-pay | Admitting: Family Medicine

## 2019-08-06 DIAGNOSIS — M545 Low back pain, unspecified: Secondary | ICD-10-CM | POA: Insufficient documentation

## 2019-08-06 MED ORDER — PREDNISONE 5 MG PO TABS: ORAL_TABLET | ORAL | 0 refills | Status: DC

## 2019-08-06 NOTE — Progress Notes (Signed)
Abigail KaplanBetty H Elliott - 43 y.o. female MRN [id number]  Date of birth: 1976-02-08  SUBJECTIVE:  Including CC & ROS.  Chief Complaint  Patient presents with  . Back Pain    upper to low back on both sides    Etter SjogrenBetty H Elliott is a 43 y.o. female that is  Presenting with left lower back pain. The pain is on the left lower paraspinal area. Pain is intermittent in nature. Pain is sharp and stabbing. No radicular symptoms. Feels like the pain is worse with prolonged standing, bending, and sitting. Unsure of any inciting event or trauma. No prior surgery. No fevers or chills.    Review of Systems  Constitutional: Negative for fever.  HENT: Negative for congestion.   Respiratory: Negative for cough.   Cardiovascular: Negative for chest pain.  Gastrointestinal: Negative for abdominal pain.  Musculoskeletal: Positive for back pain.  Neurological: Negative for weakness.  Hematological: Negative for adenopathy.    HISTORY: Past Medical, Surgical, Social, and Family History Reviewed & Updated per EMR.   Pertinent Historical Findings include:  Past Medical History:  Diagnosis Date  . Anemia     Past Surgical History:  Procedure Laterality Date  . CHOLECYSTECTOMY    . TUBAL LIGATION      Allergies  Allergen Reactions  . Grass Extracts [Gramineae Pollens]   . Latex     No family history on file.   Social History   Socioeconomic History  . Marital status: Single    Spouse name: Not on file  . Number of children: Not on file  . Years of education: Not on file  . Highest education level: Not on file  Occupational History  . Not on file  Social Needs  . Financial resource strain: Not on file  . Food insecurity    Worry: Not on file    Inability: Not on file  . Transportation needs    Medical: Not on file    Non-medical: Not on file  Tobacco Use  . Smoking status: Never Smoker  . Smokeless tobacco: Never Used  Substance and Sexual Activity  . Alcohol use: No  . Drug use: No  .  Sexual activity: Not on file  Lifestyle  . Physical activity    Days per week: Not on file    Minutes per session: Not on file  . Stress: Not on file  Relationships  . Social Musicianconnections    Talks on phone: Not on file    Gets together: Not on file    Attends religious service: Not on file    Active member of club or organization: Not on file    Attends meetings of clubs or organizations: Not on file    Relationship status: Not on file  . Intimate partner violence    Fear of current or ex partner: Not on file    Emotionally abused: Not on file    Physically abused: Not on file    Forced sexual activity: Not on file  Other Topics Concern  . Not on file  Social History Narrative  . Not on file     PHYSICAL EXAM:  VS: Ht 5\' 6"  (1.676 m)   Wt 260 lb (117.9 kg)   LMP 08/04/2019   BMI 41.97 kg/m  Physical Exam Gen: NAD, alert, cooperative with exam, well-appearing ENT: normal lips, normal nasal mucosa,  Eye: normal EOM, normal conjunctiva and lids CV:  no edema, +2 pedal pulses   Resp: no accessory muscle use,  non-labored,   Skin: no rashes, no areas of induration  Neuro: normal tone, normal sensation to touch Psych:  normal insight, alert and oriented MSK:  Back:  TTP of the left lower paraspinal muscles.  No midline tenderness  No TTp of the SI joint or greater troch  Normal IR and ER of hips  Normal strength to resistance with hip flexion, knee flexion and extension  Negative SLR NVI      ASSESSMENT & PLAN:   Low back pain Pain seems more muscular in nature. No injury. Doesn't seem related to SI joint.  - prednisone  - can take naproxen after prednisone  - continue robaxin  - counseled on HEP and supportive care - provided work note - if no improvement consider imaging or PT.

## 2019-08-06 NOTE — Assessment & Plan Note (Signed)
Pain seems more muscular in nature. No injury. Doesn't seem related to SI joint.  - prednisone  - can take naproxen after prednisone  - continue robaxin  - counseled on HEP and supportive care - provided work note - if no improvement consider imaging or PT.

## 2019-08-06 NOTE — Patient Instructions (Addendum)
Nice to meet you Please try heat on the lower back Please try the exercises  Please try massage  Please do not take the prednisone and naproxen together. Please take the naproxen after finishing the prednisone.  Please send me a message in MyChart with any questions or updates.  Please see me back in 3 weeks.   --Dr. Raeford Razor

## 2019-08-27 ENCOUNTER — Ambulatory Visit: Payer: BLUE CROSS/BLUE SHIELD | Admitting: Family Medicine

## 2019-08-27 NOTE — Progress Notes (Deleted)
  Abigail Elliott - 43 y.o. female MRN [id number]  Date of birth: 04-03-1976  SUBJECTIVE:  Including CC & ROS.  No chief complaint on file.   Abigail Elliott is a 43 y.o. female that is  ***.  ***   Review of Systems  HISTORY: Past Medical, Surgical, Social, and Family History Reviewed & Updated per EMR.   Pertinent Historical Findings include:  Past Medical History:  Diagnosis Date  . Anemia     Past Surgical History:  Procedure Laterality Date  . CHOLECYSTECTOMY    . TUBAL LIGATION      Allergies  Allergen Reactions  . Grass Extracts [Gramineae Pollens]   . Latex     No family history on file.   Social History   Socioeconomic History  . Marital status: Single    Spouse name: Not on file  . Number of children: Not on file  . Years of education: Not on file  . Highest education level: Not on file  Occupational History  . Not on file  Social Needs  . Financial resource strain: Not on file  . Food insecurity    Worry: Not on file    Inability: Not on file  . Transportation needs    Medical: Not on file    Non-medical: Not on file  Tobacco Use  . Smoking status: Never Smoker  . Smokeless tobacco: Never Used  Substance and Sexual Activity  . Alcohol use: No  . Drug use: No  . Sexual activity: Not on file  Lifestyle  . Physical activity    Days per week: Not on file    Minutes per session: Not on file  . Stress: Not on file  Relationships  . Social Herbalist on phone: Not on file    Gets together: Not on file    Attends religious service: Not on file    Active member of club or organization: Not on file    Attends meetings of clubs or organizations: Not on file    Relationship status: Not on file  . Intimate partner violence    Fear of current or ex partner: Not on file    Emotionally abused: Not on file    Physically abused: Not on file    Forced sexual activity: Not on file  Other Topics Concern  . Not on file  Social History  Narrative  . Not on file     PHYSICAL EXAM:  VS: LMP 08/04/2019  Physical Exam Gen: NAD, alert, cooperative with exam, well-appearing ENT: normal lips, normal nasal mucosa,  Eye: normal EOM, normal conjunctiva and lids CV:  no edema, +2 pedal pulses   Resp: no accessory muscle use, non-labored,  GI: no masses or tenderness, no hernia  Skin: no rashes, no areas of induration  Neuro: normal tone, normal sensation to touch Psych:  normal insight, alert and oriented MSK:  ***      ASSESSMENT & PLAN:   No problem-specific Assessment & Plan notes found for this encounter.

## 2019-11-27 ENCOUNTER — Encounter (HOSPITAL_BASED_OUTPATIENT_CLINIC_OR_DEPARTMENT_OTHER): Payer: Self-pay | Admitting: *Deleted

## 2019-11-27 ENCOUNTER — Emergency Department (HOSPITAL_BASED_OUTPATIENT_CLINIC_OR_DEPARTMENT_OTHER)
Admission: EM | Admit: 2019-11-27 | Discharge: 2019-11-27 | Disposition: A | Discharge status: Home / Self Care | Payer: BLUE CROSS/BLUE SHIELD | Attending: Emergency Medicine | Admitting: Emergency Medicine

## 2019-11-27 ENCOUNTER — Other Ambulatory Visit: Payer: Self-pay

## 2019-11-27 ENCOUNTER — Emergency Department (HOSPITAL_BASED_OUTPATIENT_CLINIC_OR_DEPARTMENT_OTHER): Payer: BLUE CROSS/BLUE SHIELD

## 2019-11-27 DIAGNOSIS — J069 Acute upper respiratory infection, unspecified: Secondary | ICD-10-CM | POA: Insufficient documentation

## 2019-11-27 DIAGNOSIS — Z20828 Contact with and (suspected) exposure to other viral communicable diseases: Secondary | ICD-10-CM | POA: Insufficient documentation

## 2019-11-27 DIAGNOSIS — R05 Cough: Secondary | ICD-10-CM | POA: Diagnosis present

## 2019-11-27 MED ORDER — PREDNISONE 10 MG PO TABS: 20.0000 mg | ORAL_TABLET | Freq: Every day | ORAL | 0 refills | Status: AC

## 2019-11-27 MED ORDER — ALBUTEROL SULFATE HFA 108 (90 BASE) MCG/ACT IN AERS: 1.0000 | INHALATION_SPRAY | Freq: Four times a day (QID) | RESPIRATORY_TRACT | 0 refills | Status: AC | PRN

## 2019-11-27 MED ORDER — BENZONATATE 100 MG PO CAPS: 100.0000 mg | ORAL_CAPSULE | Freq: Three times a day (TID) | ORAL | 0 refills | Status: AC | PRN

## 2019-11-27 NOTE — ED Triage Notes (Signed)
Cough x 1 week. denies fever

## 2019-11-27 NOTE — ED Provider Notes (Signed)
Emergency Department Provider Note   I have reviewed the triage vital signs and the nursing notes.   HISTORY  Chief Complaint Cough   HPI Abigail Elliott is a 43 y.o. female with PMH of anemia presents to the emergency department for evaluation of cough.  Symptoms been ongoing for the past week.  She denies fevers but did have some diaphoresis yesterday.  She has been feeling hot/cold at times.  Denies any hemoptysis.  She is not experiencing constant chest pain does does have some discomfort with coughing only.  Symptoms do seem worse when she is lying flat.  She does not smoke cigarettes.  She has no history of asthma or COPD.  She has not had exposure to known COVID-19 individual. No GI symptoms.    Past Medical History:  Diagnosis Date  . Anemia     Patient Active Problem List   Diagnosis Date Noted  . Low back pain 08/06/2019  . Iron deficiency anemia 02/12/2018  . Class 3 severe obesity due to excess calories with body mass index (BMI) of 40.0 to 44.9 in adult (HCC) 01/27/2018  . Hemorrhoids, external, thrombosed 06/11/2017    Past Surgical History:  Procedure Laterality Date  . CHOLECYSTECTOMY    . TUBAL LIGATION      Allergies Grass extracts [gramineae pollens] and Latex  History reviewed. No pertinent family history.  Social History Social History   Tobacco Use  . Smoking status: Never Smoker  . Smokeless tobacco: Never Used  Substance Use Topics  . Alcohol use: No  . Drug use: No    Review of Systems  Constitutional: No fever/chills Eyes: No visual changes. ENT: No sore throat. Cardiovascular: CP with cough only.  Respiratory: Denies shortness of breath. Positive cough.  Gastrointestinal: No abdominal pain.  No nausea, no vomiting.  No diarrhea.  No constipation. Genitourinary: Negative for dysuria. Musculoskeletal: Negative for back pain. Skin: Negative for rash. Neurological: Negative for headaches, focal weakness or numbness.  10-point  ROS otherwise negative.  ____________________________________________   PHYSICAL EXAM:  VITAL SIGNS: ED Triage Vitals  Enc Vitals Group     BP 11/27/19 1650 123/71     Pulse Rate 11/27/19 1650 92     Resp 11/27/19 1650 20     Temp 11/27/19 1650 98.2 F (36.8 C)     Temp Source 11/27/19 1650 Oral     SpO2 11/27/19 1650 96 %     Weight 11/27/19 1649 267 lb (121.1 kg)     Height 11/27/19 1649 5\' 5"  (1.651 m)   Constitutional: Alert and oriented. Well appearing and in no acute distress. Eyes: Conjunctivae are normal. Head: Atraumatic. Nose: No congestion/rhinnorhea. Mouth/Throat: Mucous membranes are moist.   Neck: No stridor.   Cardiovascular: Normal rate, regular rhythm. Good peripheral circulation. Grossly normal heart sounds. Respiratory: Normal respiratory effort.  No retractions. Lungs CTAB. Occasional bronchospastic cough.  Gastrointestinal: No distention.  Musculoskeletal: No gross deformities of extremities. Neurologic:  Normal speech and language.  Skin:  Skin is warm, dry and intact. No rash noted.  ____________________________________________   LABS (all labs ordered are listed, but only abnormal results are displayed)  Labs Reviewed  NOVEL CORONAVIRUS, NAA (HOSP ORDER, SEND-OUT TO REF LAB; TAT 18-24 HRS)   ____________________________________________  RADIOLOGY  DG Chest Portable 1 View  Result Date: 11/27/2019 CLINICAL DATA:  Productive cough for 1 week EXAM: PORTABLE CHEST 1 VIEW COMPARISON:  Portable exam 1706 hours compared to 11/23/2018 FINDINGS: Normal heart size and pulmonary  vascularity. Prominent soft tissue again identified in RIGHT paratracheal region, question RIGHT-side aortic arch. Lungs clear. No pleural effusion or pneumothorax. Bones unremarkable. IMPRESSION: Question RIGHT aortic arch. No acute abnormalities. Electronically Signed   By: Lavonia Dana M.D.   On: 11/27/2019 17:28     ____________________________________________   PROCEDURES  Procedure(s) performed:   Procedures  None ____________________________________________   INITIAL IMPRESSION / ASSESSMENT AND PLAN / ED COURSE  Pertinent labs & imaging results that were available during my care of the patient were reviewed by me and considered in my medical decision making (see chart for details).   Patient with upper respiratory infection type symptoms over the past week.  Afebrile here with normal oxygen or increased work of breathing.  Chest x-ray performed with 1 week of symptoms to evaluate for possible focal infiltrate/pneumonia.  This was not seen on chest x-ray.  Question right aortic arch which was discussed with patient but no specific follow-up recommended.  I do plan on testing the patient for COVID-19 but overall suspicion for this is lower.  She will remain in quarantine and follow the test results in the MyChart app.  Discussed ED return precautions.  Patient is not having chest pain to suspect ACS/PE.  [name] was evaluated in Emergency Department on 11/28/2019 for the symptoms described in the history of present illness. She was evaluated in the context of the global COVID-19 pandemic, which necessitated consideration that the patient might be at risk for infection with the SARS-CoV-2 virus that causes COVID-19. Institutional protocols and algorithms that pertain to the evaluation of patients at risk for COVID-19 are in a state of rapid change based on information released by regulatory bodies including the CDC and federal and state organizations. These policies and algorithms were followed during the patient's care in the ED.  ____________________________________________  FINAL CLINICAL IMPRESSION(S) / ED DIAGNOSES  Final diagnoses:  Viral URI with cough     NEW OUTPATIENT MEDICATIONS STARTED DURING THIS VISIT:  Discharge Medication List as of 11/27/2019  5:39 PM    START  taking these medications   Details  albuterol (VENTOLIN HFA) 108 (90 Base) MCG/ACT inhaler Inhale 1-2 puffs into the lungs every 6 (six) hours as needed for wheezing (cough)., Starting Fri 11/27/2019, Print    benzonatate (TESSALON) 100 MG capsule Take 1 capsule (100 mg total) by mouth 3 (three) times daily as needed for cough., Starting Fri 11/27/2019, Print        Note:  This document was prepared using Dragon voice recognition software and may include unintentional dictation errors.  Nanda Quinton, MD, [facility]    Charlann Wayne, Wonda [facility], MD 11/28/19 [phone]

## 2019-11-27 NOTE — Discharge Instructions (Signed)
You were seen in the emergency department with respiratory viral type symptoms.  I am testing you for COVID-19 and you will need to remain in quarantine until your results come back in the MyChart app.  Information is included in this discharge paperwork about how to set that up.  Use the medications prescribed as needed.  Please follow with your primary care doctor by telehealth visit in the coming week.  Return to the emergency department if your symptoms suddenly worsen.

## 2019-11-29 LAB — NOVEL CORONAVIRUS, NAA (HOSP ORDER, SEND-OUT TO REF LAB; TAT 18-24 HRS): SARS-CoV-2, NAA: NOT DETECTED

## 2019-12-08 ENCOUNTER — Emergency Department (HOSPITAL_BASED_OUTPATIENT_CLINIC_OR_DEPARTMENT_OTHER)
Admission: EM | Admit: 2019-12-08 | Discharge: 2019-12-08 | Disposition: A | Discharge status: Home / Self Care | Payer: BLUE CROSS/BLUE SHIELD | Attending: Emergency Medicine | Admitting: Emergency Medicine

## 2019-12-08 ENCOUNTER — Encounter (HOSPITAL_BASED_OUTPATIENT_CLINIC_OR_DEPARTMENT_OTHER): Payer: Self-pay

## 2019-12-08 ENCOUNTER — Other Ambulatory Visit: Payer: Self-pay

## 2019-12-08 DIAGNOSIS — R109 Unspecified abdominal pain: Secondary | ICD-10-CM | POA: Insufficient documentation

## 2019-12-08 DIAGNOSIS — Z5321 Procedure and treatment not carried out due to patient leaving prior to being seen by health care provider: Secondary | ICD-10-CM | POA: Insufficient documentation

## 2019-12-08 DIAGNOSIS — R112 Nausea with vomiting, unspecified: Secondary | ICD-10-CM | POA: Insufficient documentation

## 2019-12-08 DIAGNOSIS — R197 Diarrhea, unspecified: Secondary | ICD-10-CM | POA: Diagnosis not present

## 2019-12-08 LAB — COMPREHENSIVE METABOLIC PANEL
ALT: 22 U/L (ref 0–44)
AST: 21 U/L (ref 15–41)
Albumin: 4 g/dL (ref 3.5–5.0)
Alkaline Phosphatase: 94 U/L (ref 38–126)
Anion gap: 8 (ref 5–15)
BUN: 11 mg/dL (ref 6–20)
CO2: 22 mmol/L (ref 22–32)
Calcium: 8.5 mg/dL — ABNORMAL LOW (ref 8.9–10.3)
Chloride: 104 mmol/L (ref 98–111)
Creatinine, Ser: 0.81 mg/dL (ref 0.44–1.00)
GFR calc Af Amer: >60 mL/min (ref >60)
GFR calc non Af Amer: >60 mL/min (ref >60)
Glucose, Bld: 93 mg/dL (ref 70–99)
Potassium: 3.7 mmol/L (ref 3.5–5.1)
Sodium: 134 mmol/L — ABNORMAL LOW (ref 135–145)
Total Bilirubin: 0.6 mg/dL (ref 0.3–1.2)
Total Protein: 7.6 g/dL (ref 6.5–8.1)

## 2019-12-08 LAB — CBC
HCT: 43.1 % (ref 36.0–46.0)
Hemoglobin: 14 g/dL (ref 12.0–15.0)
MCH: 29.3 pg (ref 26.0–34.0)
MCHC: 32.5 g/dL (ref 30.0–36.0)
MCV: 90.2 fL (ref 80.0–100.0)
Platelets: 271 10*3/uL (ref 150–400)
RBC: 4.78 MIL/uL (ref 3.87–5.11)
RDW: 13 % (ref 11.5–15.5)
WBC: 8.1 10*3/uL (ref 4.0–10.5)
nRBC: 0 % (ref 0.0–0.2)

## 2019-12-08 LAB — URINALYSIS, ROUTINE W REFLEX MICROSCOPIC
Bilirubin Urine: NEGATIVE
Glucose, UA: NEGATIVE mg/dL
Ketones, ur: NEGATIVE mg/dL
Nitrite: NEGATIVE
Protein, ur: NEGATIVE mg/dL
Specific Gravity, Urine: 1.025 (ref 1.005–1.030)
pH: 5.5 (ref 5.0–8.0)

## 2019-12-08 LAB — URINALYSIS, MICROSCOPIC (REFLEX)

## 2019-12-08 LAB — PREGNANCY, URINE: Preg Test, Ur: NEGATIVE

## 2019-12-08 LAB — LIPASE, BLOOD: Lipase: 24 U/L (ref 11–51)

## 2019-12-08 MED ORDER — SODIUM CHLORIDE 0.9% FLUSH
3.0000 mL | Freq: Once | INTRAVENOUS | Status: DC
  Filled 2019-12-08: qty 3

## 2019-12-08 NOTE — ED Triage Notes (Signed)
Pt c/o n/v/d and abd pain started last night-NAD-steady gait

## 2020-03-26 ENCOUNTER — Other Ambulatory Visit: Payer: Self-pay

## 2020-03-26 ENCOUNTER — Emergency Department (HOSPITAL_BASED_OUTPATIENT_CLINIC_OR_DEPARTMENT_OTHER)
Admission: EM | Admit: 2020-03-26 | Discharge: 2020-03-26 | Disposition: A | Discharge status: Home / Self Care | Payer: Medicaid Other | Attending: Emergency Medicine | Admitting: Emergency Medicine

## 2020-03-26 ENCOUNTER — Encounter (HOSPITAL_BASED_OUTPATIENT_CLINIC_OR_DEPARTMENT_OTHER): Payer: Self-pay

## 2020-03-26 DIAGNOSIS — R112 Nausea with vomiting, unspecified: Secondary | ICD-10-CM | POA: Diagnosis not present

## 2020-03-26 DIAGNOSIS — R5383 Other fatigue: Secondary | ICD-10-CM | POA: Diagnosis not present

## 2020-03-26 DIAGNOSIS — R519 Headache, unspecified: Secondary | ICD-10-CM | POA: Insufficient documentation

## 2020-03-26 DIAGNOSIS — Z79899 Other long term (current) drug therapy: Secondary | ICD-10-CM | POA: Diagnosis not present

## 2020-03-26 DIAGNOSIS — R509 Fever, unspecified: Secondary | ICD-10-CM | POA: Diagnosis present

## 2020-03-26 LAB — COMPREHENSIVE METABOLIC PANEL
ALT: 25 U/L (ref 0–44)
AST: 18 U/L (ref 15–41)
Albumin: 3.8 g/dL (ref 3.5–5.0)
Alkaline Phosphatase: 89 U/L (ref 38–126)
Anion gap: 7 (ref 5–15)
BUN: 9 mg/dL (ref 6–20)
CO2: 24 mmol/L (ref 22–32)
Calcium: 9 mg/dL (ref 8.9–10.3)
Chloride: 104 mmol/L (ref 98–111)
Creatinine, Ser: 0.65 mg/dL (ref 0.44–1.00)
GFR calc Af Amer: >60 mL/min (ref >60)
GFR calc non Af Amer: >60 mL/min (ref >60)
Glucose, Bld: 95 mg/dL (ref 70–99)
Potassium: 4 mmol/L (ref 3.5–5.1)
Sodium: 135 mmol/L (ref 135–145)
Total Bilirubin: 0.3 mg/dL (ref 0.3–1.2)
Total Protein: 7.7 g/dL (ref 6.5–8.1)

## 2020-03-26 LAB — CBC WITH DIFFERENTIAL/PLATELET
Abs Immature Granulocytes: 0.01 10*3/uL (ref 0.00–0.07)
Basophils Absolute: 0 10*3/uL (ref 0.0–0.1)
Basophils Relative: 0 %
Eosinophils Absolute: 0.1 10*3/uL (ref 0.0–0.5)
Eosinophils Relative: 1 %
HCT: 38.9 % (ref 36.0–46.0)
Hemoglobin: 13 g/dL (ref 12.0–15.0)
Immature Granulocytes: 0 %
Lymphocytes Relative: 28 %
Lymphs Abs: 2 10*3/uL (ref 0.7–4.0)
MCH: 29.1 pg (ref 26.0–34.0)
MCHC: 33.4 g/dL (ref 30.0–36.0)
MCV: 87 fL (ref 80.0–100.0)
Monocytes Absolute: 0.5 10*3/uL (ref 0.1–1.0)
Monocytes Relative: 7 %
Neutro Abs: 4.5 10*3/uL (ref 1.7–7.7)
Neutrophils Relative %: 64 %
Platelets: 299 10*3/uL (ref 150–400)
RBC: 4.47 MIL/uL (ref 3.87–5.11)
RDW: 13.1 % (ref 11.5–15.5)
WBC: 7.2 10*3/uL (ref 4.0–10.5)
nRBC: 0 % (ref 0.0–0.2)

## 2020-03-26 MED ORDER — IBUPROFEN 600 MG PO TABS: 600.0000 mg | ORAL_TABLET | Freq: Four times a day (QID) | ORAL | 0 refills | Status: AC | PRN

## 2020-03-26 MED ORDER — ONDANSETRON 4 MG PO TBDP
ORAL_TABLET | ORAL | Status: AC
  Administered 2020-03-26: 4 mg
  Filled 2020-03-26: qty 1

## 2020-03-26 MED ORDER — ONDANSETRON HCL 4 MG PO TABS: 4.0000 mg | ORAL_TABLET | Freq: Four times a day (QID) | ORAL | 0 refills | Status: AC

## 2020-03-26 MED ORDER — IBUPROFEN 400 MG PO TABS
600.0000 mg | ORAL_TABLET | Freq: Once | ORAL | Status: AC
  Administered 2020-03-26: 17:00:00 600 mg via ORAL
  Filled 2020-03-26: qty 1

## 2020-03-26 MED ORDER — ONDANSETRON HCL 4 MG PO TABS
4.0000 mg | ORAL_TABLET | Freq: Once | ORAL | Status: DC
  Filled 2020-03-26: qty 1

## 2020-03-26 NOTE — ED Notes (Addendum)
Passed fluid challenge, no issues

## 2020-03-26 NOTE — Discharge Instructions (Signed)
You may alternate taking Tylenol and Ibuprofen as needed for pain control. You may take 400-600 mg of ibuprofen every 6 hours and 500-1000 mg of Tylenol every 6 hours. Do not exceed 4000 mg of Tylenol daily as this can lead to liver damage. Also, make sure to take Ibuprofen with meals as it can cause an upset stomach. Do not take other NSAIDs while taking Ibuprofen such as (Aleve, Naprosyn, Aspirin, Celebrex, etc) and do not take more than the prescribed dose as this can lead to ulcers and bleeding in your GI tract. You may use warm and cold compresses to help with your symptoms.   You were given a prescription for zofran to help with your nausea. Please take as directed.  Please follow up with your primary doctor within the next 7-10 days for re-evaluation and further treatment of your symptoms.   Please return to the ER sooner if you have any new or worsening symptoms.  

## 2020-03-26 NOTE — ED Provider Notes (Signed)
[facility] EMERGENCY DEPARTMENT Provider Note   CSN: [id number] Arrival date & time: 03/26/20  1516     History Chief Complaint  Patient presents with  . Fever    Abigail Elliott is a 44 y.o. female.  HPI   Patient is a 44 year old female with a history of anemia, migraines, who presents emergency department today for evaluation of fever, fatigue, headache, nausea and vomiting that started yesterday.  States that she got [employer] vaccine 2 days ago.  Later that night her symptoms started.  She has had 2 episodes of vomiting.  She denies abdominal pain.  Denies other associated symptoms.  She is been taking Tylenol at home without relief.  Past Medical History:  Diagnosis Date  . Anemia     Patient Active Problem List   Diagnosis Date Noted  . Low back pain 08/06/2019  . Iron deficiency anemia 02/12/2018  . Class 3 severe obesity due to excess calories with body mass index (BMI) of 40.0 to 44.9 in adult (HCC) 01/27/2018  . Hemorrhoids, external, thrombosed 06/11/2017    Past Surgical History:  Procedure Laterality Date  . CHOLECYSTECTOMY    . TUBAL LIGATION       OB History   No obstetric history on file.     No family history on file.  Social History   Tobacco Use  . Smoking status: Never Smoker  . Smokeless tobacco: Never Used  Substance Use Topics  . Alcohol use: No  . Drug use: No    Home Medications Prior to Admission medications   Medication Sig Start Date End Date Taking? Authorizing Provider  albuterol (VENTOLIN HFA) 108 (90 Base) MCG/ACT inhaler Inhale 1-2 puffs into the lungs every 6 (six) hours as needed for wheezing (cough). 11/27/19   Long, Arlyss Repress, MD  benzonatate (TESSALON) 100 MG capsule Take 1 capsule (100 mg total) by mouth 3 (three) times daily as needed for cough. 11/27/19   Long, Arlyss Repress, MD  ibuprofen (ADVIL) 600 MG tablet Take 1 tablet (600 mg total) by mouth every 6 (six) hours as needed. 03/26/20   Kyriaki Moder S,  PA-C  lidocaine (LIDODERM) 5 % Place 1 patch onto the skin daily. Apply to area of most significant pain. Remove & Discard patch within 12 hours 08/04/19   Petrucelli, Lelon Mast R, PA-C  meloxicam (MOBIC) 15 MG tablet Take 1 tablet (15 mg total) by mouth daily. 01/12/19   Hudnall, Azucena Fallen, MD  methocarbamol (ROBAXIN) 500 MG tablet Take 1 tablet (500 mg total) by mouth every 8 (eight) hours as needed for muscle spasms. 08/04/19   Petrucelli, Samantha R, PA-C  naproxen (NAPROSYN) 500 MG tablet Take 1 tablet (500 mg total) by mouth 2 (two) times daily. 08/04/19   Petrucelli, Samantha R, PA-C  ondansetron (ZOFRAN) 4 MG tablet Take 1 tablet (4 mg total) by mouth every 6 (six) hours. 03/26/20   Collan Schoenfeld S, PA-C    Allergies    Grass extracts [gramineae pollens] and Latex  Review of Systems   Review of Systems  Constitutional: Positive for fatigue and fever.  Eyes: Negative for visual disturbance.  Respiratory: Negative for shortness of breath.   Cardiovascular: Negative for chest pain.  Gastrointestinal: Positive for nausea and vomiting. Negative for abdominal pain.  Genitourinary: Negative for pelvic pain.  Musculoskeletal: Negative for back pain.  Neurological: Positive for headaches.    Physical Exam Updated Vital Signs BP 115/71 (BP Location: Left Arm)   Pulse 73  Temp 98.3 F (36.8 C) (Oral)   Resp 20   Ht 5\' 5"  (1.651 m)   Wt 124.7 kg   LMP 03/21/2020   SpO2 98%   BMI 45.76 kg/m   Physical Exam Vitals and nursing note reviewed.  Constitutional:      General: She is not in acute distress.    Appearance: She is well-developed.  HENT:     Head: Normocephalic and atraumatic.  Eyes:     Conjunctiva/sclera: Conjunctivae normal.  Cardiovascular:     Rate and Rhythm: Normal rate and regular rhythm.     Heart sounds: Normal heart sounds. No murmur.  Pulmonary:     Effort: Pulmonary effort is normal. No respiratory distress.     Breath sounds: Normal breath sounds. No  wheezing, rhonchi or rales.  Abdominal:     Palpations: Abdomen is soft.     Tenderness: There is no abdominal tenderness. There is no guarding or rebound.  Musculoskeletal:     Cervical back: Neck supple.  Skin:    General: Skin is warm and dry.  Neurological:     Mental Status: She is alert.     Comments: Mental Status:  Alert, thought content appropriate, able to give a coherent history. Speech fluent without evidence of aphasia. Able to follow 2 step commands without difficulty.  Cranial Nerves:  II:  pupils equal, round, reactive to light III,IV, VI: ptosis not present, extra-ocular motions intact bilaterally  V,VII: smile symmetric, facial light touch sensation equal VIII: hearing grossly normal to voice  X: uvula elevates symmetrically  XI: bilateral shoulder shrug symmetric and strong XII: midline tongue extension without fassiculations Motor:  Normal tone. 5/5 strength of BUE and BLE major muscle groups including strong and equal grip strength and dorsiflexion/plantar flexion Sensory: light touch normal in all extremities.     ED Results / Procedures / Treatments   Labs (all labs ordered are listed, but only abnormal results are displayed) Labs Reviewed  CBC WITH DIFFERENTIAL/PLATELET  COMPREHENSIVE METABOLIC PANEL    EKG None  Radiology No results found.  Procedures Procedures (including critical care time)  Medications Ordered in ED Medications  ibuprofen (ADVIL) tablet 600 mg (600 mg Oral Given 03/26/20 1643)  ondansetron (ZOFRAN-ODT) 4 MG disintegrating tablet (4 mg  Given 03/26/20 1643)    ED Course  I have reviewed the triage vital signs and the nursing notes.  Pertinent labs & imaging results that were available during my care of the patient were reviewed by me and considered in my medical decision making (see chart for details).    MDM Rules/Calculators/A&P                      44 year old female with fevers, headache nausea and vomiting after  getting the [employer] vaccine 2 days ago.  This was the first dose.  Abdomen soft and nontender.  Her vital signs are normal.  Her neurologic exam is normal.  Her abdomen is soft and nontender.  We will check labs to rule electrolyte derangement.  Will give ibuprofen and antiemetics and p.o. challenge her.  CBC w/o leukocytosis or anemia CMP w/o gross electrolyte derangement, normal kidney and liver function  Pt was able to tolerate po in the ED. Her labs are reassuring. Has had some improvement after ibuprofen and zofran and states that her symptoms are tolerable. I have low suspicion for any emergent cause of her sxs and feel that this is likely related to the vaccine that  she recently had. Advised to continue tylenol/motrin. Will give rx for zofran. Advised to f/u with pcp and RTER for new or worsening sxs. All questions answered, pt stable for d/c.   Final Clinical Impression(s) / ED Diagnoses Final diagnoses:  Non-intractable vomiting with nausea, unspecified vomiting type    Rx / DC Orders ED Discharge Orders         Ordered    ondansetron (ZOFRAN) 4 MG tablet  Every 6 hours     03/26/20 1712    ibuprofen (ADVIL) 600 MG tablet  Every 6 hours PRN     03/26/20 1712           Lorey Pallett S, PA-C 03/26/20 1712    Rolan Bucco, MD 03/26/20 1720

## 2020-03-26 NOTE — ED Triage Notes (Signed)
Pt received Pfizer vaccine on 4/22. Yesterday pt had a fever on and off and today pt has HA, fatigue, nausea.

## 2020-03-26 NOTE — ED Notes (Signed)
Per EDP order, pt given fluids and/or food for PO challenge. Pt verbalized understanding to utilize call bell if nausea or emesis occur. 

## 2020-05-11 IMAGING — DX DG CHEST 1V PORT
1 series · 1 of 1 positions shown · non-contrast
Comparison: Portable exam 1181 hours compared to 11/23/2018

CLINICAL DATA: Productive cough for 1 week

EXAM:
PORTABLE CHEST 1 VIEW

[chest ap]
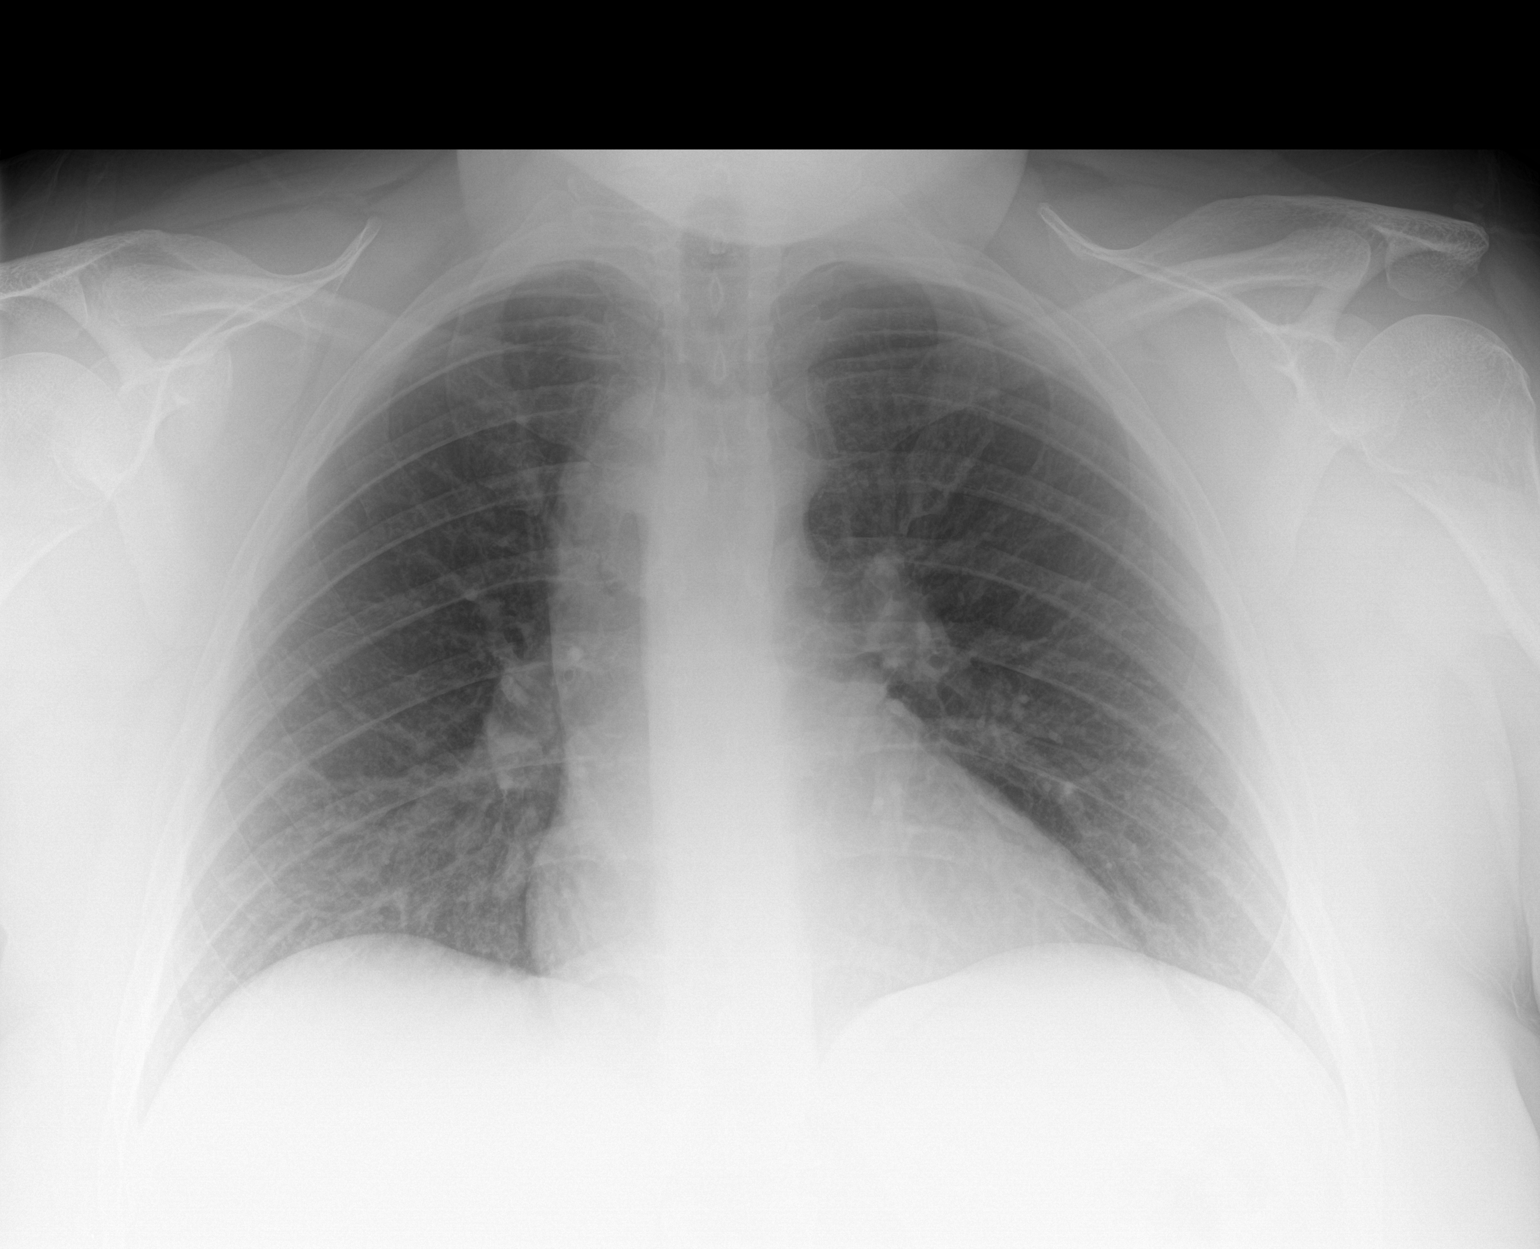

[1 of 1 positions shown; findings below may reference images not displayed]

FINDINGS: Normal heart size and pulmonary vascularity.

Prominent soft tissue again identified in RIGHT paratracheal region,
question RIGHT-side aortic arch.

Lungs clear.

No pleural effusion or pneumothorax.

Bones unremarkable.
IMPRESSION: Question RIGHT aortic arch.

No acute abnormalities.

## 2020-09-13 ENCOUNTER — Encounter (HOSPITAL_BASED_OUTPATIENT_CLINIC_OR_DEPARTMENT_OTHER): Payer: Self-pay | Admitting: *Deleted

## 2020-09-13 ENCOUNTER — Emergency Department (HOSPITAL_BASED_OUTPATIENT_CLINIC_OR_DEPARTMENT_OTHER)
Admission: EM | Admit: 2020-09-13 | Discharge: 2020-09-13 | Disposition: A | Discharge status: Home / Self Care | Payer: Medicaid Other | Attending: Emergency Medicine | Admitting: Emergency Medicine

## 2020-09-13 ENCOUNTER — Other Ambulatory Visit: Payer: Self-pay

## 2020-09-13 DIAGNOSIS — U071 COVID-19: Secondary | ICD-10-CM | POA: Insufficient documentation

## 2020-09-13 DIAGNOSIS — Z91040 Latex allergy status: Secondary | ICD-10-CM | POA: Insufficient documentation

## 2020-09-13 DIAGNOSIS — R519 Headache, unspecified: Secondary | ICD-10-CM | POA: Diagnosis present

## 2020-09-13 LAB — RESPIRATORY PANEL BY RT PCR (FLU A&B, COVID)
Influenza A by PCR: NEGATIVE
Influenza B by PCR: NEGATIVE
SARS Coronavirus 2 by RT PCR: POSITIVE — AB

## 2020-09-13 MED ORDER — KETOROLAC TROMETHAMINE 60 MG/2ML IM SOLN: 60.0000 mg | Freq: Once | INTRAMUSCULAR | Status: DC

## 2020-09-13 MED ORDER — DEXAMETHASONE 6 MG PO TABS: 10.0000 mg | ORAL_TABLET | Freq: Once | ORAL | Status: DC

## 2020-09-13 NOTE — ED Provider Notes (Signed)
[facility] EMERGENCY DEPARTMENT Provider Note   CSN: [id number] Arrival date & time: 09/13/20  1929     History Chief Complaint  Patient presents with  . Headache    Abigail Elliott is a 44 y.o. female.  The history is provided by the patient.  URI Presenting symptoms: cough and sore throat   Presenting symptoms: no ear pain and no fever   Severity:  Mild Onset quality:  Gradual Timing:  Intermittent Progression:  Waxing and waning Chronicity:  New Relieved by:  Nothing Worsened by:  Nothing Associated symptoms: arthralgias, headaches and myalgias        Past Medical History:  Diagnosis Date  . Anemia     Patient Active Problem List   Diagnosis Date Noted  . Low back pain 08/06/2019  . Iron deficiency anemia 02/12/2018  . Class 3 severe obesity due to excess calories with body mass index (BMI) of 40.0 to 44.9 in adult (HCC) 01/27/2018  . Hemorrhoids, external, thrombosed 06/11/2017    Past Surgical History:  Procedure Laterality Date  . CHOLECYSTECTOMY    . TUBAL LIGATION       OB History   No obstetric history on file.     History reviewed. No pertinent family history.  Social History   Tobacco Use  . Smoking status: Never Smoker  . Smokeless tobacco: Never Used  Vaping Use  . Vaping Use: Never used  Substance Use Topics  . Alcohol use: No  . Drug use: No    Home Medications Prior to Admission medications   Medication Sig Start Date End Date Taking? Authorizing Provider  albuterol (VENTOLIN HFA) 108 (90 Base) MCG/ACT inhaler Inhale 1-2 puffs into the lungs every 6 (six) hours as needed for wheezing (cough). 11/27/19   Long, Arlyss Repress, MD  benzonatate (TESSALON) 100 MG capsule Take 1 capsule (100 mg total) by mouth 3 (three) times daily as needed for cough. 11/27/19   Long, Arlyss Repress, MD  ibuprofen (ADVIL) 600 MG tablet Take 1 tablet (600 mg total) by mouth every 6 (six) hours as needed. 03/26/20   Couture, Cortni S, PA-C  lidocaine  (LIDODERM) 5 % Place 1 patch onto the skin daily. Apply to area of most significant pain. Remove & Discard patch within 12 hours 08/04/19   Petrucelli, Lelon Mast R, PA-C  meloxicam (MOBIC) 15 MG tablet Take 1 tablet (15 mg total) by mouth daily. 01/12/19   Hudnall, Azucena Fallen, MD  methocarbamol (ROBAXIN) 500 MG tablet Take 1 tablet (500 mg total) by mouth every 8 (eight) hours as needed for muscle spasms. 08/04/19   Petrucelli, Samantha R, PA-C  naproxen (NAPROSYN) 500 MG tablet Take 1 tablet (500 mg total) by mouth 2 (two) times daily. 08/04/19   Petrucelli, Samantha R, PA-C  ondansetron (ZOFRAN) 4 MG tablet Take 1 tablet (4 mg total) by mouth every 6 (six) hours. 03/26/20   Couture, Cortni S, PA-C    Allergies    Grass extracts [gramineae pollens] and Latex  Review of Systems   Review of Systems  Constitutional: Negative for chills and fever.  HENT: Positive for sore throat. Negative for ear pain.   Eyes: Negative for pain and visual disturbance.  Respiratory: Positive for cough. Negative for shortness of breath.   Cardiovascular: Negative for chest pain and palpitations.  Gastrointestinal: Negative for abdominal pain and vomiting.  Genitourinary: Negative for dysuria and hematuria.  Musculoskeletal: Positive for arthralgias and myalgias. Negative for back pain.  Skin: Negative for color  change and rash.  Neurological: Positive for headaches. Negative for seizures and syncope.  All other systems reviewed and are negative.   Physical Exam Updated Vital Signs BP 122/81 (BP Location: Left Arm)   Pulse 83   Temp 98.4 F (36.9 C) (Oral)   Resp 14   Ht 5\' 6"  (1.676 m)   Wt 122.5 kg   LMP 09/06/2020   SpO2 99%   BMI 43.58 kg/m   Physical Exam Vitals and nursing note reviewed.  Constitutional:      General: She is not in acute distress.    Appearance: She is well-developed.  HENT:     Head: Normocephalic and atraumatic.     Mouth/Throat:     Mouth: Mucous membranes are moist.      Pharynx: Oropharynx is clear.  Eyes:     Extraocular Movements: Extraocular movements intact.     Conjunctiva/sclera: Conjunctivae normal.  Cardiovascular:     Rate and Rhythm: Normal rate and regular rhythm.     Heart sounds: Normal heart sounds. No murmur heard.   Pulmonary:     Effort: Pulmonary effort is normal. No respiratory distress.     Breath sounds: Normal breath sounds.  Abdominal:     Palpations: Abdomen is soft.     Tenderness: There is no abdominal tenderness.  Musculoskeletal:        General: Normal range of motion.     Cervical back: Normal range of motion and neck supple.  Skin:    General: Skin is warm and dry.     Capillary Refill: Capillary refill takes less than 2 seconds.  Neurological:     Mental Status: She is alert and oriented to person, place, and time.     Cranial Nerves: No cranial nerve deficit.  Psychiatric:        Mood and Affect: Mood normal.     ED Results / Procedures / Treatments   Labs (all labs ordered are listed, but only abnormal results are displayed) Labs Reviewed  RESPIRATORY PANEL BY RT PCR (FLU A&B, COVID) - Abnormal; Notable for the following components:      Result Value   SARS Coronavirus 2 by RT PCR POSITIVE (*)    All other components within normal limits    EKG None  Radiology No results found.  Procedures Procedures (including critical care time)  Medications Ordered in ED Medications - No data to display  ED Course  I have reviewed the triage vital signs and the nursing notes.  Pertinent labs & imaging results that were available during my care of the patient were reviewed by me and considered in my medical decision making (see chart for details).    MDM Rules/Calculators/A&P                          Abigail Elliott is a 44 year old female who presents to the ED with Covid type symptoms.  Positive for Covid.  Normal vitals.  No respiratory distress.  Normal room air oxygenation.  Overall patient appears  well.  Recommend continued use of Tylenol.  Given return precautions and discharged in ED in good condition.  This chart was dictated using voice recognition software.  Despite best efforts to proofread,  errors can occur which can change the documentation meaning.  Abigail Elliott was evaluated in Emergency Department on 09/13/2020 for the symptoms described in the history of present illness. She was evaluated in the context of the global  COVID-19 pandemic, which necessitated consideration that the patient might be at risk for infection with the SARS-CoV-2 virus that causes COVID-19. Institutional protocols and algorithms that pertain to the evaluation of patients at risk for COVID-19 are in a state of rapid change based on information released by regulatory bodies including the CDC and federal and state organizations. These policies and algorithms were followed during the patient's care in the ED.    Final Clinical Impression(s) / ED Diagnoses Final diagnoses:  COVID-19    Rx / DC Orders ED Discharge Orders    None       Virgina Norfolk, DO 09/13/20 2110

## 2020-09-13 NOTE — ED Triage Notes (Signed)
HA, body aches, throat pain x 2 days, fever tmax 99 PTA.

## 2020-09-13 NOTE — ED Notes (Signed)
Date and time results received: 09/13/20 8:52 PM  (use smartphrase ".now" to insert current time)  Test: COVID Critical Value: Positive  Name of Provider Notified: Dr. Murvin Donning  Orders Received? Or Actions Taken?: no new orders

## 2020-09-14 ENCOUNTER — Other Ambulatory Visit: Payer: Self-pay | Admitting: Family

## 2020-09-14 ENCOUNTER — Telehealth: Payer: Self-pay | Admitting: Family

## 2020-09-14 DIAGNOSIS — Z6841 Body Mass Index (BMI) 40.0 and over, adult: Secondary | ICD-10-CM

## 2020-09-14 DIAGNOSIS — U071 COVID-19: Secondary | ICD-10-CM

## 2020-09-14 DIAGNOSIS — Z609 Problem related to social environment, unspecified: Secondary | ICD-10-CM

## 2020-09-14 NOTE — Progress Notes (Signed)
I connected by phone with Abigail Elliott on 09/14/2020 at 9:34 AM to discuss the potential use of a new treatment for mild to moderate COVID-19 viral infection in non-hospitalized patients.  This patient is a 44 y.o. female that meets the FDA criteria for Emergency Use Authorization of COVID monoclonal antibody casirivimab/imdevimab or bamlanivimab/eteseviamb.  Has a (+) direct SARS-CoV-2 viral test result  Has mild or moderate COVID-19   Is NOT hospitalized due to COVID-19  Is within 10 days of symptom onset  Has at least one of the high risk factor(s) for progression to severe COVID-19 and/or hospitalization as defined in EUA.  Specific high risk criteria : BMI > 25 and Other high risk medical condition per CDC:  high social vulnerability score   I have spoken and communicated the following to the patient or parent/caregiver regarding COVID monoclonal antibody treatment:  1. FDA has authorized the emergency use for the treatment of mild to moderate COVID-19 in adults and pediatric patients with positive results of direct SARS-CoV-2 viral testing who are 67 years of age and older weighing at least 40 kg, and who are at high risk for progressing to severe COVID-19 and/or hospitalization.  2. The significant known and potential risks and benefits of COVID monoclonal antibody, and the extent to which such potential risks and benefits are unknown.  3. Information on available alternative treatments and the risks and benefits of those alternatives, including clinical trials.  4. Patients treated with COVID monoclonal antibody should continue to self-isolate and use infection control measures (e.g., wear mask, isolate, social distance, avoid sharing personal items, clean and disinfect "high touch" surfaces, and frequent handwashing) according to CDC guidelines.   5. The patient or parent/caregiver has the option to accept or refuse COVID monoclonal antibody treatment.  After reviewing this  information with the patient, the patient has agreed to receive one of the available covid 19 monoclonal antibodies and will be provided an appropriate fact sheet prior to infusion.   Abigail Luz, FNP 09/14/2020 9:34 AM

## 2020-09-14 NOTE — Telephone Encounter (Signed)
Called to Discuss with patient about Covid symptoms and the use of the monoclonal antibody infusion for those with mild to moderate Covid symptoms and at a high risk of hospitalization.     Pt appears to qualify for this infusion due to co-morbid conditions and/or a member of an at-risk group in accordance with the FDA Emergency Use Authorization.    Abigail Elliott was seen at the [facility] ED on 10/12 with cough, sore throat, arthralgias, and headaches. Symptoms started 10/12. Qualifying risk factors include BMI >25 and high social vulnerability score.  Discussed the risks, benefits, and potential costs of treatment with monoclonal antibodies and she wishes to proceed with treatment.   Hello Abigail Elliott,   You have been scheduled to receive the monoclonal antibody therapy at [facility]: 09/15/20 at 9:30am   If you have been tested outside of a [facility] - you MUST bring a copy of your positive test with you the morning of your appointment. You may take a photo of this and upload to your MyChart portal or have the testing facility fax the result to [phone]    The address for the infusion clinic site is:  --GPS address is [address] - the parking is located near [employer] building where you will see  COVID19 Infusion feather banner marking the entrance to parking.   (see photos below)            --Enter into the 2nd entrance where the "wave, flag banner" is at the road. Turn into this 2nd entrance and immediately turn left to park in 1 of the 5 parking spots.   --Please stay in your car and call the desk for assistance inside [phone].   --Average time in department is roughly 2 hours for Regeneron treatment - this includes preparation of the medication, IV start and the required 1 hour monitoring after the infusion.    Should you develop worsening shortness of breath, chest pain or severe breathing problems please do not wait for this appointment and  go to the Emergency room for evaluation and treatment. You will undergo another oxygen screen before your infusion to ensure this is the best treatment option for you. There is a chance that the best decision may be to send you to the Emergency Room for evaluation at the time of your appointment.   The day of your visit you should: Marland Kitchen Get plenty of rest the night before and drink plenty of water . Eat a light meal/snack before coming and take your medications as prescribed  . Wear warm, comfortable clothes with a shirt that can roll-up over the elbow (will need IV start).  . Wear a mask  . Consider bringing some activity to help pass the time  Many commercial insurers are waiving bills related to COVID treatment however some have ranged from $300-640. We are starting to see some insurers send bills to patients later for the administration of the medication - we are learning more information but you may receive a bill after your appointment.  Please contact your insurance agent to discuss prior to your appointment if you would like further details about billing specific to your policy.    The CPT code is [phone] for your reference.    Marcos Eke, NP 09/14/2020 9:31 AM

## 2020-09-15 ENCOUNTER — Ambulatory Visit (HOSPITAL_COMMUNITY): Payer: Medicaid Other

## 2020-09-16 ENCOUNTER — Ambulatory Visit (HOSPITAL_COMMUNITY)
Admission: RE | Admit: 2020-09-16 | Discharge: 2020-09-16 | Disposition: A | Discharge status: Home / Self Care | Payer: Medicaid Other | Source: Ambulatory Visit | Attending: Pulmonary Disease | Admitting: Pulmonary Disease

## 2020-09-16 DIAGNOSIS — Z6841 Body Mass Index (BMI) 40.0 and over, adult: Secondary | ICD-10-CM | POA: Insufficient documentation

## 2020-09-16 DIAGNOSIS — Z609 Problem related to social environment, unspecified: Secondary | ICD-10-CM | POA: Diagnosis present

## 2020-09-16 DIAGNOSIS — U071 COVID-19: Secondary | ICD-10-CM | POA: Insufficient documentation

## 2020-09-16 MED ORDER — DIPHENHYDRAMINE HCL 50 MG/ML IJ SOLN: 50.0000 mg | Freq: Once | INTRAMUSCULAR | Status: DC | PRN

## 2020-09-16 MED ORDER — SODIUM CHLORIDE 0.9 % IV SOLN: INTRAVENOUS | Status: DC | PRN

## 2020-09-16 MED ORDER — ALBUTEROL SULFATE HFA 108 (90 BASE) MCG/ACT IN AERS: 2.0000 | INHALATION_SPRAY | Freq: Once | RESPIRATORY_TRACT | Status: DC | PRN

## 2020-09-16 MED ORDER — FAMOTIDINE IN NACL 20-0.9 MG/50ML-% IV SOLN: 20.0000 mg | Freq: Once | INTRAVENOUS | Status: DC | PRN

## 2020-09-16 MED ORDER — METHYLPREDNISOLONE SODIUM SUCC 125 MG IJ SOLR: 125.0000 mg | Freq: Once | INTRAMUSCULAR | Status: DC | PRN

## 2020-09-16 MED ORDER — SODIUM CHLORIDE 0.9 % IV SOLN: Freq: Once | INTRAVENOUS | Status: AC

## 2020-09-16 MED ORDER — EPINEPHRINE 0.3 MG/0.3ML IJ SOAJ: 0.3000 mg | Freq: Once | INTRAMUSCULAR | Status: DC | PRN

## 2020-09-16 NOTE — Progress Notes (Signed)
  Diagnosis: COVID-19  Physician: Dr Wright  Procedure: Covid Infusion Clinic Med: bamlanivimab\etesevimab infusion - Provided patient with bamlanimivab\etesevimab fact sheet for patients, parents and caregivers prior to infusion.  Complications: No immediate complications noted.  Discharge: Discharged home   Abigail Elliott 09/16/2020  

## 2020-09-16 NOTE — Discharge Instructions (Signed)

## 2020-11-30 ENCOUNTER — Other Ambulatory Visit: Payer: Self-pay

## 2020-11-30 ENCOUNTER — Emergency Department (HOSPITAL_BASED_OUTPATIENT_CLINIC_OR_DEPARTMENT_OTHER)
Admission: EM | Admit: 2020-11-30 | Discharge: 2020-11-30 | Disposition: A | Discharge status: Home / Self Care | Payer: Medicaid Other | Attending: Emergency Medicine | Admitting: Emergency Medicine

## 2020-11-30 ENCOUNTER — Emergency Department (HOSPITAL_BASED_OUTPATIENT_CLINIC_OR_DEPARTMENT_OTHER): Payer: Medicaid Other

## 2020-11-30 ENCOUNTER — Encounter (HOSPITAL_BASED_OUTPATIENT_CLINIC_OR_DEPARTMENT_OTHER): Payer: Self-pay | Admitting: Emergency Medicine

## 2020-11-30 DIAGNOSIS — R1084 Generalized abdominal pain: Secondary | ICD-10-CM | POA: Diagnosis present

## 2020-11-30 DIAGNOSIS — Z91040 Latex allergy status: Secondary | ICD-10-CM | POA: Insufficient documentation

## 2020-11-30 DIAGNOSIS — Z20822 Contact with and (suspected) exposure to covid-19: Secondary | ICD-10-CM | POA: Insufficient documentation

## 2020-11-30 DIAGNOSIS — K654 Sclerosing mesenteritis: Secondary | ICD-10-CM | POA: Insufficient documentation

## 2020-11-30 LAB — COMPREHENSIVE METABOLIC PANEL
ALT: 23 U/L (ref 0–44)
AST: 19 U/L (ref 15–41)
Albumin: 3.7 g/dL (ref 3.5–5.0)
Alkaline Phosphatase: 91 U/L (ref 38–126)
Anion gap: 9 (ref 5–15)
BUN: 11 mg/dL (ref 6–20)
CO2: 23 mmol/L (ref 22–32)
Calcium: 8.9 mg/dL (ref 8.9–10.3)
Chloride: 101 mmol/L (ref 98–111)
Creatinine, Ser: 0.72 mg/dL (ref 0.44–1.00)
GFR, Estimated: >60 mL/min (ref >60)
Glucose, Bld: 104 mg/dL — ABNORMAL HIGH (ref 70–99)
Potassium: 3.7 mmol/L (ref 3.5–5.1)
Sodium: 133 mmol/L — ABNORMAL LOW (ref 135–145)
Total Bilirubin: 0.4 mg/dL (ref 0.3–1.2)
Total Protein: 7.1 g/dL (ref 6.5–8.1)

## 2020-11-30 LAB — CBC
HCT: 37.3 % (ref 36.0–46.0)
Hemoglobin: 12.2 g/dL (ref 12.0–15.0)
MCH: 27.9 pg (ref 26.0–34.0)
MCHC: 32.7 g/dL (ref 30.0–36.0)
MCV: 85.4 fL (ref 80.0–100.0)
Platelets: 329 10*3/uL (ref 150–400)
RBC: 4.37 MIL/uL (ref 3.87–5.11)
RDW: 13.6 % (ref 11.5–15.5)
WBC: 8.5 10*3/uL (ref 4.0–10.5)
nRBC: 0 % (ref 0.0–0.2)

## 2020-11-30 LAB — URINALYSIS, ROUTINE W REFLEX MICROSCOPIC
Bilirubin Urine: NEGATIVE
Glucose, UA: NEGATIVE mg/dL
Ketones, ur: NEGATIVE mg/dL
Leukocytes,Ua: NEGATIVE
Nitrite: NEGATIVE
Protein, ur: NEGATIVE mg/dL
Specific Gravity, Urine: 1.02 (ref 1.005–1.030)
pH: 5 (ref 5.0–8.0)

## 2020-11-30 LAB — URINALYSIS, MICROSCOPIC (REFLEX)

## 2020-11-30 LAB — PREGNANCY, URINE: Preg Test, Ur: NEGATIVE

## 2020-11-30 LAB — LIPASE, BLOOD: Lipase: 24 U/L (ref 11–51)

## 2020-11-30 LAB — SARS CORONAVIRUS 2 (TAT 6-24 HRS): SARS Coronavirus 2: NEGATIVE

## 2020-11-30 MED ORDER — PANTOPRAZOLE SODIUM 40 MG IV SOLR
40.0000 mg | Freq: Once | INTRAVENOUS | Status: AC
  Administered 2020-11-30: 18:00:00 40 mg via INTRAVENOUS
  Filled 2020-11-30: qty 40

## 2020-11-30 MED ORDER — ONDANSETRON HCL 4 MG/2ML IJ SOLN
4.0000 mg | Freq: Once | INTRAMUSCULAR | Status: AC
  Administered 2020-11-30: 19:00:00 4 mg via INTRAVENOUS
  Filled 2020-11-30: qty 2

## 2020-11-30 MED ORDER — ONDANSETRON 4 MG PO TBDP: 4.0000 mg | ORAL_TABLET | Freq: Three times a day (TID) | ORAL | 0 refills | Status: AC | PRN

## 2020-11-30 MED ORDER — NAPROXEN 500 MG PO TABS: 500.0000 mg | ORAL_TABLET | Freq: Two times a day (BID) | ORAL | 0 refills | Status: DC

## 2020-11-30 MED ORDER — PANTOPRAZOLE SODIUM 20 MG PO TBEC: 20.0000 mg | DELAYED_RELEASE_TABLET | Freq: Every day | ORAL | 0 refills | Status: AC

## 2020-11-30 MED ORDER — IOHEXOL 300 MG/ML  SOLN
100.0000 mL | Freq: Once | INTRAMUSCULAR | Status: AC | PRN
  Administered 2020-11-30: 17:00:00 100 mL via INTRAVENOUS

## 2020-11-30 MED ORDER — SODIUM CHLORIDE 0.9 % IV BOLUS
1000.0000 mL | Freq: Once | INTRAVENOUS | Status: AC
  Administered 2020-11-30: 16:00:00 1000 mL via INTRAVENOUS

## 2020-11-30 MED ORDER — ONDANSETRON HCL 4 MG/2ML IJ SOLN
4.0000 mg | Freq: Once | INTRAMUSCULAR | Status: AC
  Administered 2020-11-30: 16:00:00 4 mg via INTRAVENOUS
  Filled 2020-11-30: qty 2

## 2020-11-30 NOTE — ED Notes (Signed)
Presents with abd pain x 3 days, feeling bloated, appetite decreased, after eating feels nauseated, has vomited x 1 this am, retching otherwise.

## 2020-11-30 NOTE — ED Provider Notes (Signed)
[facility] EMERGENCY DEPARTMENT Provider Note   CSN: [id number] Arrival date & time: 11/30/20  1148     History Chief Complaint  Patient presents with  . Abdominal Pain    Abigail Elliott is a 44 y.o. female.  Patient with history of obesity, anemia, recent Covid infection in October 2021.  Here with a 3-day history of epigastric pain, nausea, cough and ear pain.  States she feels bloated and has had a poor appetite with nausea and vomiting.  Last episode of vomiting was this morning.  Does feel nauseated and bloated.  She used a "bowel cleanout 3 days ago" after feeling constipated.  Ports having normal bowel movements since.  She normally goes every other day.  Denies diarrhea.  Denies any black or bloody stools.  Denies any pain with urination or blood in the urine.  Denies any vaginal bleeding or discharge.  No chest pain or shortness of breath.  She is coughing with nonproductive sputum.  Has had similar pain in the past when she had her gallbladder removed.  No other abdominal surgeries.  No chest pain.  Feels fullness in her ears which are chronic.  The history is provided by the patient.  Abdominal Pain Associated symptoms: constipation, cough, nausea and vomiting   Associated symptoms: no chest pain, no dysuria, no fever, no hematuria and no sore throat        Past Medical History:  Diagnosis Date  . Anemia     Patient Active Problem List   Diagnosis Date Noted  . Low back pain 08/06/2019  . Iron deficiency anemia 02/12/2018  . Class 3 severe obesity due to excess calories with body mass index (BMI) of 40.0 to 44.9 in adult (HCC) 01/27/2018  . Hemorrhoids, external, thrombosed 06/11/2017    Past Surgical History:  Procedure Laterality Date  . CHOLECYSTECTOMY    . TUBAL LIGATION       OB History   No obstetric history on file.     No family history on file.  Social History   Tobacco Use  . Smoking status: Never Smoker  . Smokeless tobacco:  Never Used  Vaping Use  . Vaping Use: Never used  Substance Use Topics  . Alcohol use: No  . Drug use: No    Home Medications Prior to Admission medications   Medication Sig Start Date End Date Taking? Authorizing Provider  albuterol (VENTOLIN HFA) 108 (90 Base) MCG/ACT inhaler Inhale 1-2 puffs into the lungs every 6 (six) hours as needed for wheezing (cough). 11/27/19   Long, Arlyss Repress, MD  benzonatate (TESSALON) 100 MG capsule Take 1 capsule (100 mg total) by mouth 3 (three) times daily as needed for cough. 11/27/19   Long, Arlyss Repress, MD  ibuprofen (ADVIL) 600 MG tablet Take 1 tablet (600 mg total) by mouth every 6 (six) hours as needed. 03/26/20   Couture, Cortni S, PA-C  lidocaine (LIDODERM) 5 % Place 1 patch onto the skin daily. Apply to area of most significant pain. Remove & Discard patch within 12 hours 08/04/19   Petrucelli, Lelon Mast R, PA-C  meloxicam (MOBIC) 15 MG tablet Take 1 tablet (15 mg total) by mouth daily. 01/12/19   Hudnall, Azucena Fallen, MD  methocarbamol (ROBAXIN) 500 MG tablet Take 1 tablet (500 mg total) by mouth every 8 (eight) hours as needed for muscle spasms. 08/04/19   Petrucelli, Samantha R, PA-C  naproxen (NAPROSYN) 500 MG tablet Take 1 tablet (500 mg total) by mouth 2 (two)  times daily. 08/04/19   Petrucelli, Samantha R, PA-C  ondansetron (ZOFRAN) 4 MG tablet Take 1 tablet (4 mg total) by mouth every 6 (six) hours. 03/26/20   Couture, Cortni S, PA-C    Allergies    Grass extracts [gramineae pollens] and Latex  Review of Systems   Review of Systems  Constitutional: Positive for activity change and appetite change. Negative for fever.  HENT: Positive for ear pain. Negative for congestion and sore throat.   Eyes: Negative for visual disturbance.  Respiratory: Positive for cough. Negative for chest tightness.   Cardiovascular: Negative for chest pain.  Gastrointestinal: Positive for abdominal pain, constipation, nausea and vomiting.  Genitourinary: Negative for dysuria and  hematuria.  Musculoskeletal: Negative for arthralgias and myalgias.  Neurological: Positive for weakness. Negative for dizziness and headaches.   all other systems are negative except as noted in the HPI and PMH.    Physical Exam Updated Vital Signs BP 113/79 (BP Location: Right Arm)   Pulse 78   Temp 98.5 F (36.9 C) (Oral)   Resp 18   SpO2 100%   Physical Exam Vitals and nursing note reviewed.  Constitutional:      General: She is not in acute distress.    Appearance: She is well-developed and well-nourished. She is not ill-appearing.  HENT:     Head: Normocephalic and atraumatic.     Comments: Serous effusions bilaterally    Mouth/Throat:     Mouth: Oropharynx is clear and moist.     Pharynx: No oropharyngeal exudate.  Eyes:     Extraocular Movements: EOM normal.     Conjunctiva/sclera: Conjunctivae normal.     Pupils: Pupils are equal, round, and reactive to light.  Neck:     Comments: No meningismus. Cardiovascular:     Rate and Rhythm: Normal rate and regular rhythm.     Pulses: Intact distal pulses.     Heart sounds: Normal heart sounds. No murmur heard.   Pulmonary:     Effort: Pulmonary effort is normal. No respiratory distress.     Breath sounds: Normal breath sounds.  Abdominal:     Palpations: Abdomen is soft.     Tenderness: There is abdominal tenderness. There is guarding. There is no rebound.     Comments: Epigastric, periumbilical pain diffusely, no guarding or rebound.  Musculoskeletal:        General: No tenderness or edema. Normal range of motion.     Cervical back: Normal range of motion and neck supple.     Comments: No CVAT  Skin:    General: Skin is warm.  Neurological:     Mental Status: She is alert and oriented to person, place, and time.     Cranial Nerves: No cranial nerve deficit.     Motor: No abnormal muscle tone.     Coordination: Coordination normal.     Comments: No ataxia on finger to nose bilaterally. No pronator drift. 5/5  strength throughout. CN 2-12 intact.Equal grip strength. Sensation intact.   Psychiatric:        Mood and Affect: Mood and affect normal.        Behavior: Behavior normal.     ED Results / Procedures / Treatments   Labs (all labs ordered are listed, but only abnormal results are displayed) Labs Reviewed  COMPREHENSIVE METABOLIC PANEL - Abnormal; Notable for the following components:      Result Value   Sodium 133 (*)    Glucose, Bld 104 (*)  All other components within normal limits  URINALYSIS, ROUTINE W REFLEX MICROSCOPIC - Abnormal; Notable for the following components:   APPearance CLOUDY (*)    Hgb urine dipstick SMALL (*)    All other components within normal limits  URINALYSIS, MICROSCOPIC (REFLEX) - Abnormal; Notable for the following components:   Bacteria, UA RARE (*)    All other components within normal limits  SARS CORONAVIRUS 2 (TAT 6-24 HRS)  LIPASE, BLOOD  CBC  PREGNANCY, URINE    EKG None  Radiology CT ABDOMEN PELVIS W CONTRAST  Result Date: 11/30/2020 CLINICAL DATA:  Abdominal pain for 3 days. Diminished appetite. Postprandial nausea. Episode of vomiting. EXAM: CT ABDOMEN AND PELVIS WITH CONTRAST TECHNIQUE: Multidetector CT imaging of the abdomen and pelvis was performed using the standard protocol following bolus administration of intravenous contrast. CONTRAST:  OMNIPAQUE IOHEXOL 300 MG/ML  SOLN COMPARISON:  Most recent CT 03/12/2017 FINDINGS: Lower chest: Included descending aorta is on the right, query right aortic arch, aortic arch not included in the field of view. No focal airspace disease or pleural fluid. Hepatobiliary: Mildly enlarged liver spanning 20 cm cranial caudal. There is no focal hepatic abnormality. Clips in the gallbladder fossa postcholecystectomy. No biliary dilatation. Pancreas: Portions of the pancreas are obscured by motion. Allowing for this, no peripancreatic fat stranding or ductal dilatation. Spleen: Normal in size without  focal abnormality. Adrenals/Urinary Tract: Normal adrenal glands. Extrarenal pelvis configuration of the right kidney, unchanged from prior exam. Homogeneous renal enhancement. There is symmetric excretion on delayed phase imaging. Small rounded filling defect within the renal pyramids on delayed phase imaging, can be seen in the setting of papillary necrosis, for example series 7, image 14. Urinary bladder is unremarkable. Stomach/Bowel: Decompressed stomach. There is no small bowel obstruction, inflammation or wall thickening. Motion on initial imaging partially obscures detailed assessment, majority of the motion limited areas are included on delayed phase without focal bowel abnormality. Small volume of colonic stool. Normal appendix. Vascular/Lymphatic: Normal caliber abdominal aorta, normally position on the left. Patent portal vein. Multiple small central mesenteric lymph nodes with adjacent mesenteric edema, similar findings seen on prior exam. No enlarged lymph nodes in the abdomen or pelvis. Reproductive: Prominent endometrial thickness is 17 mm. Ovaries are quiescent. There is no adnexal mass. Other: No free air or ascites. Tiny fat containing umbilical hernia. Musculoskeletal: L4-L5 levels limited by motion. No acute osseous abnormalities are seen. IMPRESSION: 1. No acute abnormality in the abdomen/pelvis. 2. Multiple small central mesenteric lymph nodes with adjacent mesenteric edema, also seen on prior exam. 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 There are no associated bowel abnormalities. 3. Prominent endometrial thickness is 17 mm. Recommend nonemergent pelvic ultrasound for characterization on an elective basis. 4. Small rounded filling defect within the renal pyramids on delayed phase imaging, can be seen in the setting of papillary necrosis. 5. Mild hepatomegaly. 6. Included descending thoracic aorta is on the right, query right aortic arch. Electronically Signed   By:  Narda Rutherford M.D.   On: 11/30/2020 17:18   DG Chest Portable 1 View  Result Date: 11/30/2020 CLINICAL DATA:  Cough and pain EXAM: PORTABLE CHEST 1 VIEW COMPARISON:  September 15, 2020 FINDINGS: Lungs are clear. Heart size and pulmonary vascularity are normal. No adenopathy. No pneumothorax. No bone lesions. IMPRESSION: Lungs clear.  Cardiac silhouette normal. Electronically Signed   By: Bretta Bang III M.D.   On: 11/30/2020 13:09    Procedures Procedures (including critical care time)  Medications Ordered in ED Medications  ondansetron (ZOFRAN) injection 4 mg (has no administration in time range)  sodium chloride 0.9 % bolus 1,000 mL (1,000 mLs Intravenous New Bag/Given 11/30/20 1615)    ED Course  I have reviewed the triage vital signs and the nursing notes.  Pertinent labs & imaging results that were available during my care of the patient were reviewed by me and considered in my medical decision making (see chart for details).    MDM Rules/Calculators/A&P                         3 days of epigastric abdominal pain with nausea and vomiting.,  Dry cough, ear pain and pressure.  Abdomen is soft without peritoneal signs.  Labs reassuring.  Chest x-ray is negative.  Urinalysis was clear.  Abdomen soft without peritoneal signs.  CT scans as above.  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  She was informed of the thickened endometrium need for gynecology follow-up and ultrasound.  She has been taking anti-inflammatories for her carpal tunnel syndrome.  Will initiate PPI.  Avoid alcohol, caffeine, spicy foods.  Results d/w Dr. Sheliah HatchKinsinger. States no specific followup for mesenteric panniculitis other than pain control with ibuprofen. ' Patient informed of thickened endometrium and need for GYN followup and ultrasound.  Follow-up with PCP as well as gynecology.  Return precautions discussed.  Abigail Elliott was  evaluated in Emergency Department on 11/30/2020 for the symptoms described in the history of present illness. She was evaluated in the context of the global COVID-19 pandemic, which necessitated consideration that the patient might be at risk for infection with the SARS-CoV-2 virus that causes COVID-19. Institutional protocols and algorithms that pertain to the evaluation of patients at risk for COVID-19 are in a state of rapid change based on information released by regulatory bodies including the CDC and federal and state organizations. These policies and algorithms were followed during the patient's care in the ED.   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

## 2020-11-30 NOTE — ED Notes (Signed)
Awaiting paperwork from MD to discharge pt

## 2020-11-30 NOTE — ED Notes (Signed)
Pt states she has a cough as well.

## 2020-11-30 NOTE — ED Notes (Signed)
Vomited small amount of the cola she drank, vomited approx 30cc, drank 100cc, MD notified

## 2020-11-30 NOTE — ED Triage Notes (Addendum)
abd pain and vomiting x 3 days states feels like stomach in swollen nausea , dry heaves  No diarrhea has had bad cough and bilateral ear pain left worse than right

## 2020-11-30 NOTE — Discharge Instructions (Addendum)
Your CT scan shows some inflammation of the mesentery which may be contributing to your abdominal pain.  The surgeon will see you in the office regarding this but that should improve on its own with anti-inflammatory medications. Your CT also showed some thickening of your lining of the uterus.  You should follow-up with a gynecologist regarding this as well as her primary doctor to have an ultrasound to determine whether your uterus needs further evaluation. Return to the ED with new or worsening symptoms.

## 2020-11-30 NOTE — ED Notes (Signed)
ED Provider at bedside. 

## 2023-03-18 ENCOUNTER — Encounter: Payer: Self-pay | Admitting: *Deleted
